# Patient Record
Sex: Female | Born: 1970 | Race: Black or African American | Hispanic: No | Marital: Single | Smoking: Former smoker
Health system: Southern US, Community
[De-identification: ages and names within clinical notes are randomized; demographics above are authoritative.]

## PROBLEM LIST (undated history)

## (undated) DIAGNOSIS — I10 Essential (primary) hypertension: Secondary | ICD-10-CM

## (undated) DIAGNOSIS — I639 Cerebral infarction, unspecified: Secondary | ICD-10-CM

## (undated) HISTORY — PX: CHOLECYSTECTOMY: SHX55

---

## 1998-11-29 ENCOUNTER — Ambulatory Visit (HOSPITAL_COMMUNITY): Admission: RE | Admit: 1998-11-29 | Discharge: 1998-11-29 | Payer: Self-pay | Admitting: *Deleted

## 1998-12-11 ENCOUNTER — Encounter: Admission: RE | Admit: 1998-12-11 | Discharge: 1998-12-11 | Payer: Self-pay | Admitting: Obstetrics & Gynecology

## 1998-12-25 ENCOUNTER — Encounter: Admission: RE | Admit: 1998-12-25 | Discharge: 1998-12-25 | Payer: Self-pay | Admitting: Obstetrics & Gynecology

## 1999-01-08 ENCOUNTER — Inpatient Hospital Stay (HOSPITAL_COMMUNITY): Admission: AD | Admit: 1999-01-08 | Discharge: 1999-01-08 | Payer: Self-pay | Admitting: Obstetrics

## 1999-01-29 ENCOUNTER — Encounter: Admission: RE | Admit: 1999-01-29 | Discharge: 1999-01-29 | Payer: Self-pay | Admitting: Obstetrics & Gynecology

## 1999-02-09 ENCOUNTER — Inpatient Hospital Stay (HOSPITAL_COMMUNITY): Admission: AD | Admit: 1999-02-09 | Discharge: 1999-02-13 | Payer: Self-pay | Admitting: *Deleted

## 1999-02-19 ENCOUNTER — Encounter: Admission: RE | Admit: 1999-02-19 | Discharge: 1999-02-19 | Payer: Self-pay | Admitting: Obstetrics & Gynecology

## 1999-02-19 ENCOUNTER — Encounter (HOSPITAL_COMMUNITY): Admission: RE | Admit: 1999-02-19 | Discharge: 1999-03-12 | Payer: Self-pay | Admitting: Obstetrics & Gynecology

## 1999-02-21 ENCOUNTER — Encounter: Payer: Self-pay | Admitting: Obstetrics

## 1999-02-21 ENCOUNTER — Inpatient Hospital Stay (HOSPITAL_COMMUNITY): Admission: AD | Admit: 1999-02-21 | Discharge: 1999-02-21 | Payer: Self-pay | Admitting: Obstetrics

## 1999-02-24 ENCOUNTER — Inpatient Hospital Stay (HOSPITAL_COMMUNITY): Admission: AD | Admit: 1999-02-24 | Discharge: 1999-02-24 | Payer: Self-pay | Admitting: Obstetrics & Gynecology

## 1999-03-05 ENCOUNTER — Encounter: Admission: RE | Admit: 1999-03-05 | Discharge: 1999-03-05 | Payer: Self-pay | Admitting: Obstetrics & Gynecology

## 1999-03-09 ENCOUNTER — Inpatient Hospital Stay (HOSPITAL_COMMUNITY): Admission: AD | Admit: 1999-03-09 | Discharge: 1999-03-09 | Payer: Self-pay | Admitting: Obstetrics

## 1999-03-11 ENCOUNTER — Inpatient Hospital Stay (HOSPITAL_COMMUNITY): Admission: AD | Admit: 1999-03-11 | Discharge: 1999-03-13 | Payer: Self-pay | Admitting: *Deleted

## 2003-05-31 ENCOUNTER — Emergency Department (HOSPITAL_COMMUNITY): Admission: AD | Admit: 2003-05-31 | Discharge: 2003-05-31 | Payer: Self-pay | Admitting: Family Medicine

## 2003-06-05 ENCOUNTER — Emergency Department (HOSPITAL_COMMUNITY): Admission: EM | Admit: 2003-06-05 | Discharge: 2003-06-05 | Payer: Self-pay | Admitting: Emergency Medicine

## 2003-10-24 ENCOUNTER — Emergency Department (HOSPITAL_COMMUNITY): Admission: EM | Admit: 2003-10-24 | Discharge: 2003-10-24 | Payer: Self-pay | Admitting: Emergency Medicine

## 2003-12-13 ENCOUNTER — Inpatient Hospital Stay (HOSPITAL_COMMUNITY): Admission: AD | Admit: 2003-12-13 | Discharge: 2003-12-13 | Payer: Self-pay | Admitting: Obstetrics

## 2004-03-20 ENCOUNTER — Inpatient Hospital Stay (HOSPITAL_COMMUNITY): Admission: AD | Admit: 2004-03-20 | Discharge: 2004-03-25 | Payer: Self-pay | Admitting: Obstetrics

## 2004-03-24 ENCOUNTER — Ambulatory Visit: Payer: Self-pay | Admitting: Neonatology

## 2004-03-28 ENCOUNTER — Inpatient Hospital Stay (HOSPITAL_COMMUNITY): Admission: AD | Admit: 2004-03-28 | Discharge: 2004-04-02 | Payer: Self-pay | Admitting: Obstetrics

## 2004-04-03 ENCOUNTER — Inpatient Hospital Stay (HOSPITAL_COMMUNITY): Admission: AD | Admit: 2004-04-03 | Discharge: 2004-04-03 | Payer: Self-pay | Admitting: Obstetrics

## 2004-04-09 ENCOUNTER — Inpatient Hospital Stay (HOSPITAL_COMMUNITY): Admission: AD | Admit: 2004-04-09 | Discharge: 2004-04-11 | Payer: Self-pay | Admitting: Obstetrics

## 2004-04-25 ENCOUNTER — Observation Stay (HOSPITAL_COMMUNITY): Admission: AD | Admit: 2004-04-25 | Discharge: 2004-04-26 | Payer: Self-pay | Admitting: Obstetrics

## 2004-05-01 ENCOUNTER — Inpatient Hospital Stay (HOSPITAL_COMMUNITY): Admission: AD | Admit: 2004-05-01 | Discharge: 2004-05-04 | Payer: Self-pay | Admitting: Obstetrics

## 2004-05-06 ENCOUNTER — Inpatient Hospital Stay (HOSPITAL_COMMUNITY): Admission: AD | Admit: 2004-05-06 | Discharge: 2004-05-06 | Payer: Self-pay | Admitting: Obstetrics

## 2004-09-18 ENCOUNTER — Emergency Department (HOSPITAL_COMMUNITY): Admission: EM | Admit: 2004-09-18 | Discharge: 2004-09-18 | Payer: Self-pay | Admitting: Family Medicine

## 2005-05-27 IMAGING — US US FETAL BPP W/O NONSTRESS
1 series · 18 of 22 positions shown · non-contrast
Comparison: none

CLINICAL DATA: 36 weeks gestation and contracting.  Nonreactive NST.

[Series 1: us fetal bpp w/o nonstress · non-contrast · 18 of 22 slices shown]
[im 1/22]
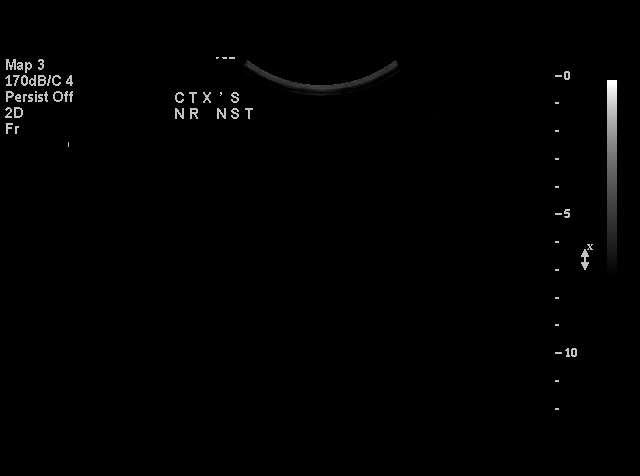
[im 2/22]
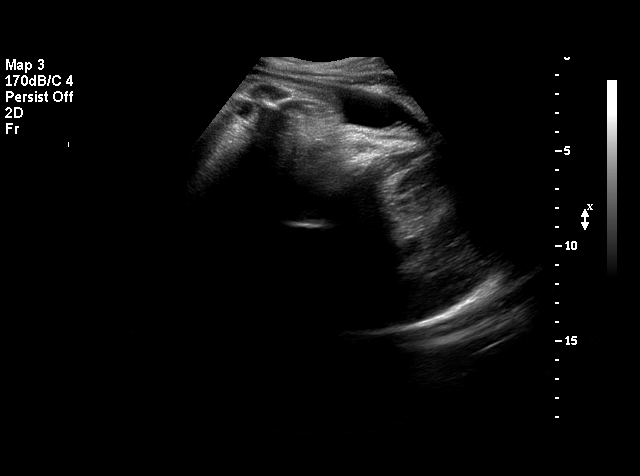
[im 4/22]
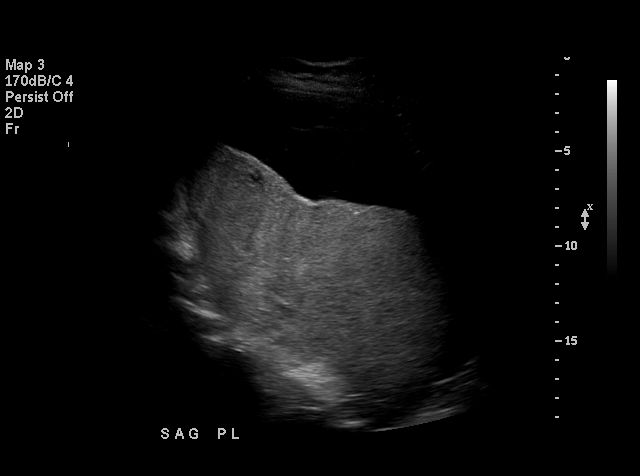
[im 5/22]
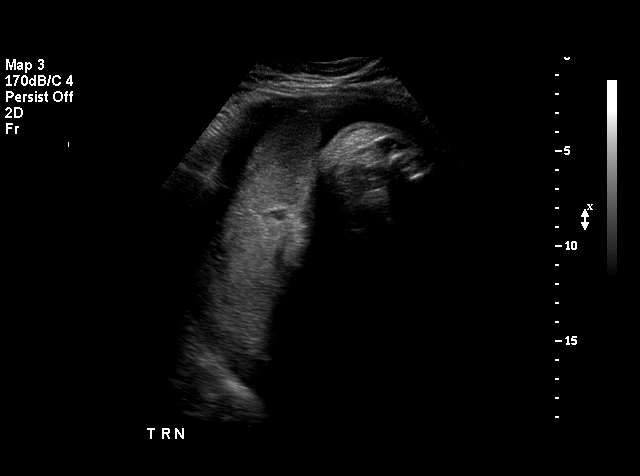
[im 6/22]
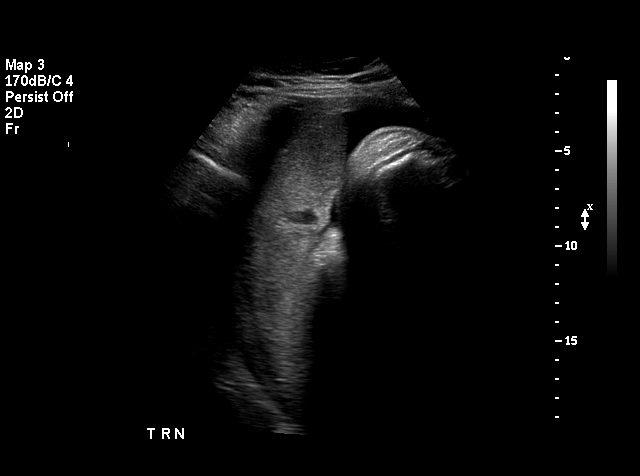
[im 7/22]
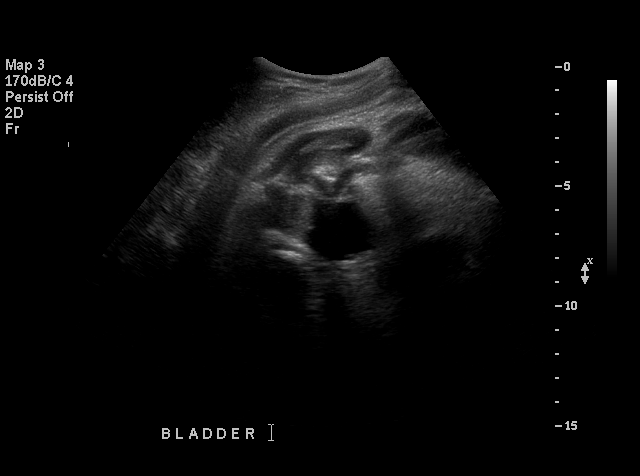
[im 8/22]
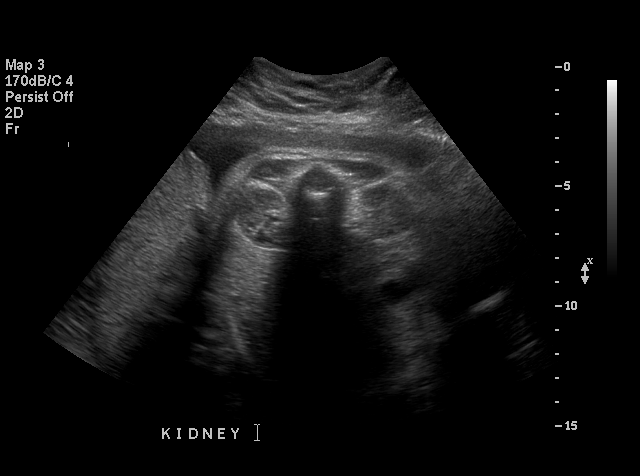
[im 10/22]
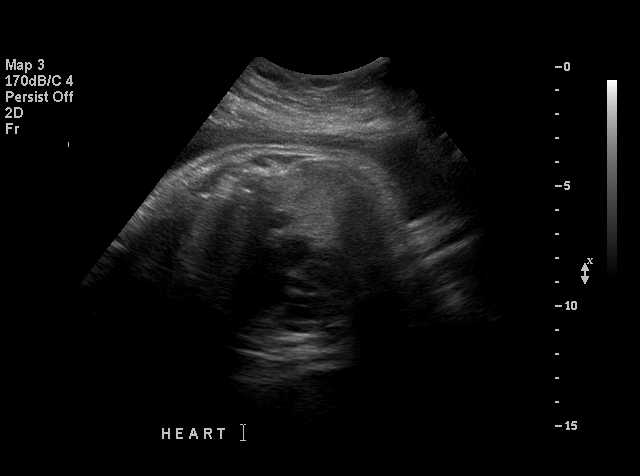
[im 11/22]
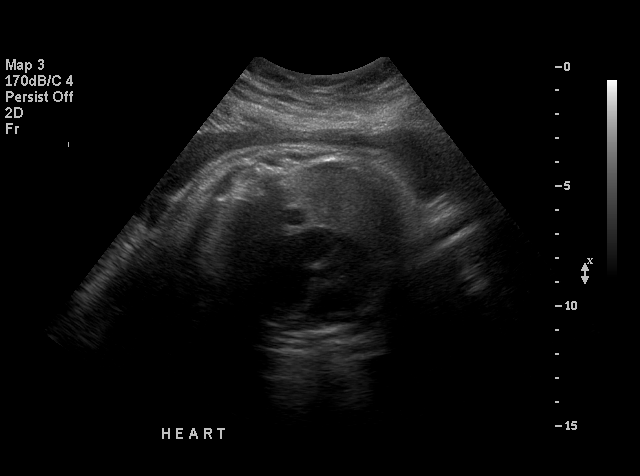
[im 12/22]
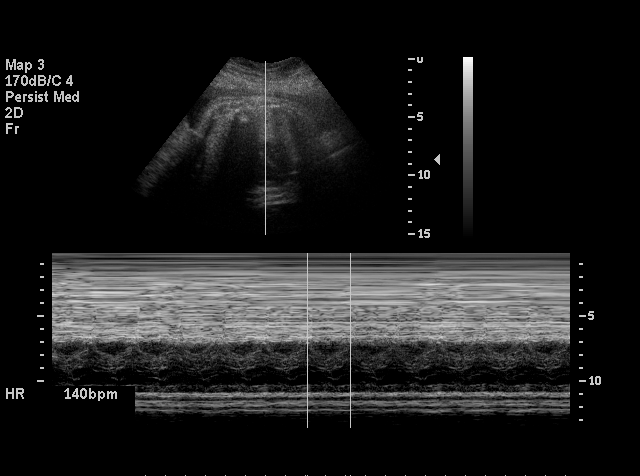
[im 13/22]
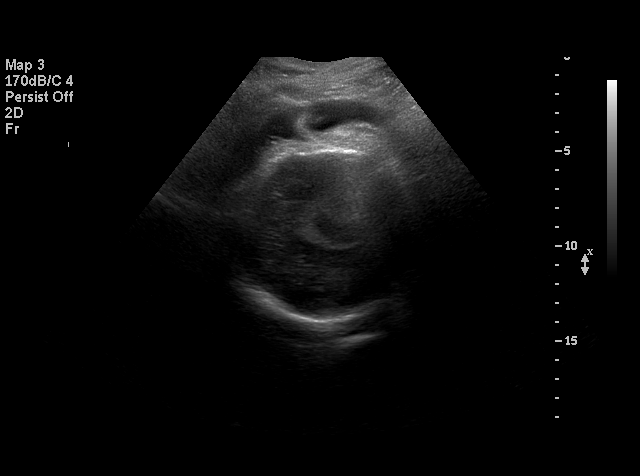
[im 15/22]
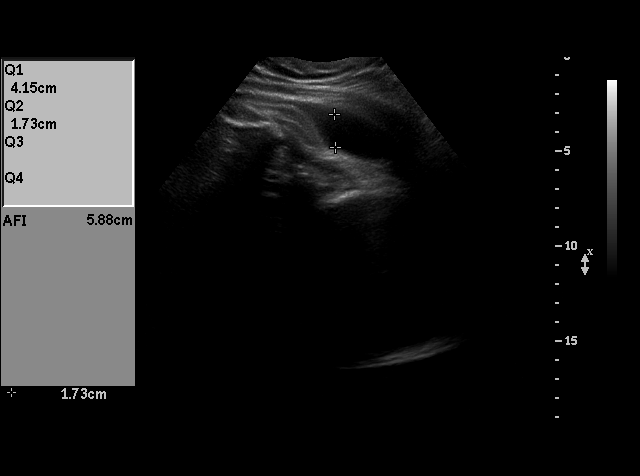
[im 16/22]
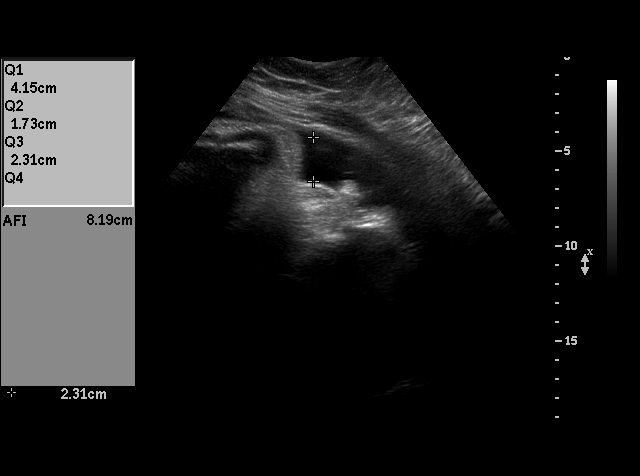
[im 17/22]
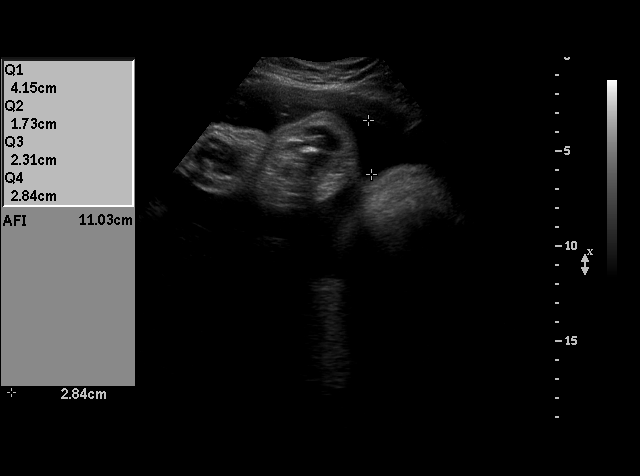
[im 18/22]
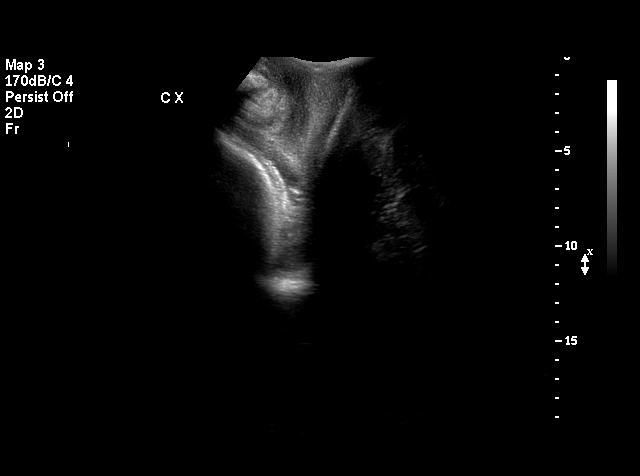
[im 19/22]
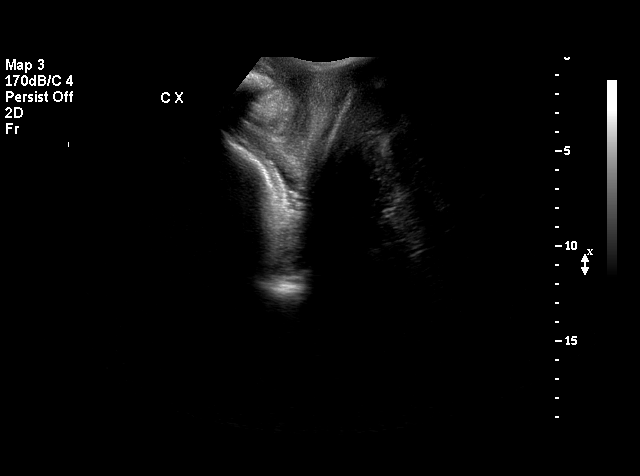
[im 21/22]
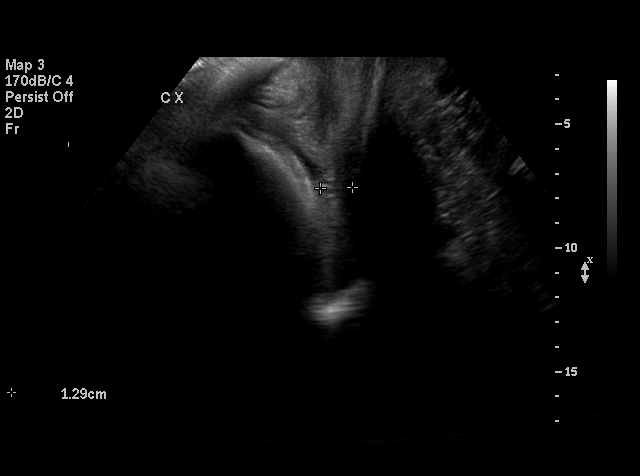
[im 22/22]
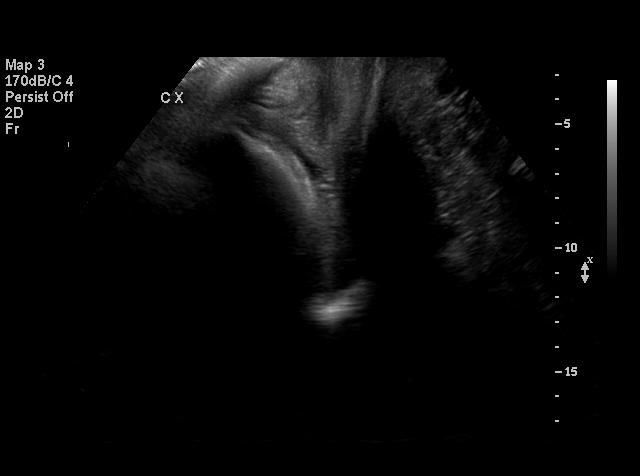

[18 of 22 positions shown; findings below may reference images not displayed]

BIOPHYSICAL PROFILE

 Number of Fetuses:  1
 Heart rate:  140
 Movement:  Yes
 Breathing:  Yes
 Presentation:  Cephalic
 Placental Location:  Posterior, right lateral
 Grade:  I
 Previa:  No
 Amniotic Fluid (Subjective):  Normal
 Amniotic Fluid (Objective):  11.0 cm AFI (5th -95th%ile = 7.9 ? 24.9 cm for 35 wks)

 Fetal measurements and complete anatomic evaluation were not requested.  The following fetal anatomy was visualized on this exam:  Stomach, kidneys, and bladder.

 BPP SCORING
 Movements:  2  Time:  10 minutes
 Breathing:  2
 Tone:  2
 Amniotic Fluid:  2
 Total Score:  8

 MATERNAL FINDINGS:
 Cervix:  1.0 cm Translabially
IMPRESSION: Single living intrauterine fetus in cephalic presentation with subjectively and quantitatively normal amniotic fluid volume.
 Biophysical profile score is [DATE] after 10 minutes of observation.

## 2005-06-28 ENCOUNTER — Emergency Department (HOSPITAL_COMMUNITY): Admission: EM | Admit: 2005-06-28 | Discharge: 2005-06-29 | Payer: Self-pay | Admitting: Emergency Medicine

## 2005-10-20 IMAGING — CR DG CHEST 2V
2 series · 2 of 2 positions shown · non-contrast
Comparison: 03/28/2004.

CLINICAL DATA: Left chest pain and shortness of breath.

CHEST - 2 VIEW

[view not recorded (1 of 2)]
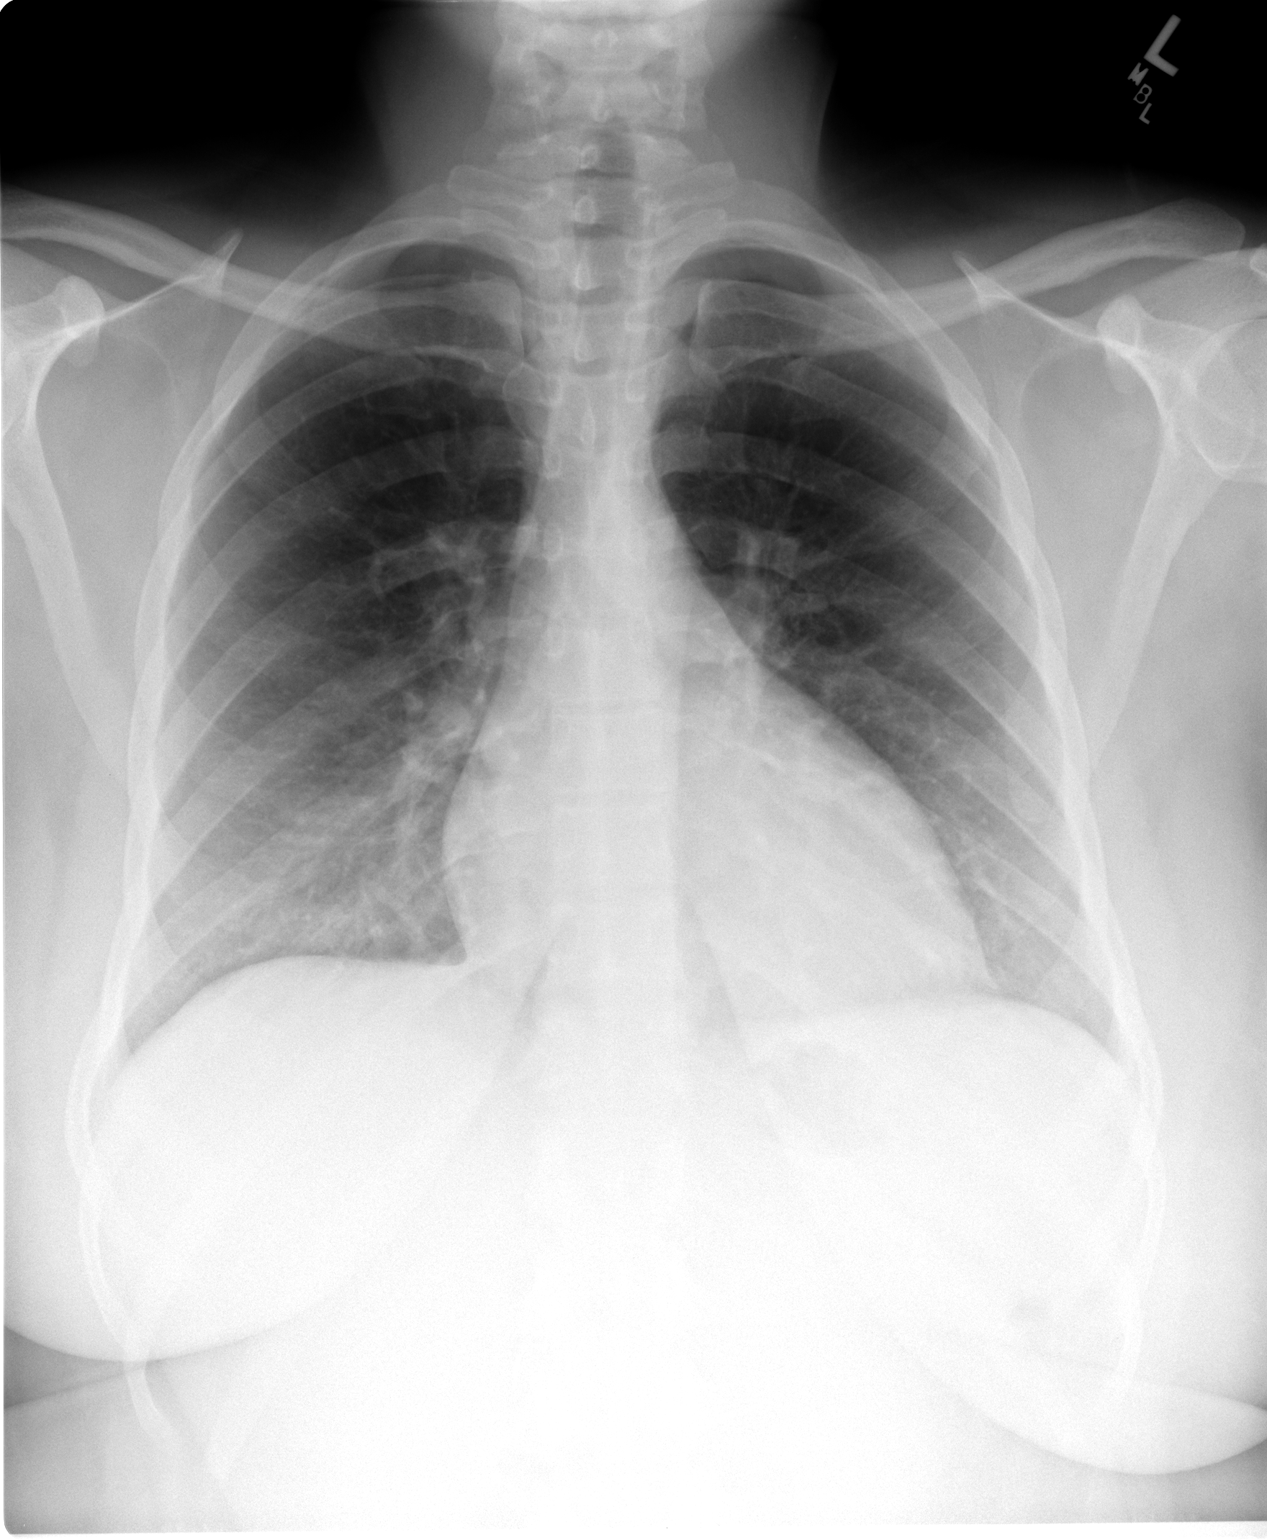

[view not recorded (2 of 2)]
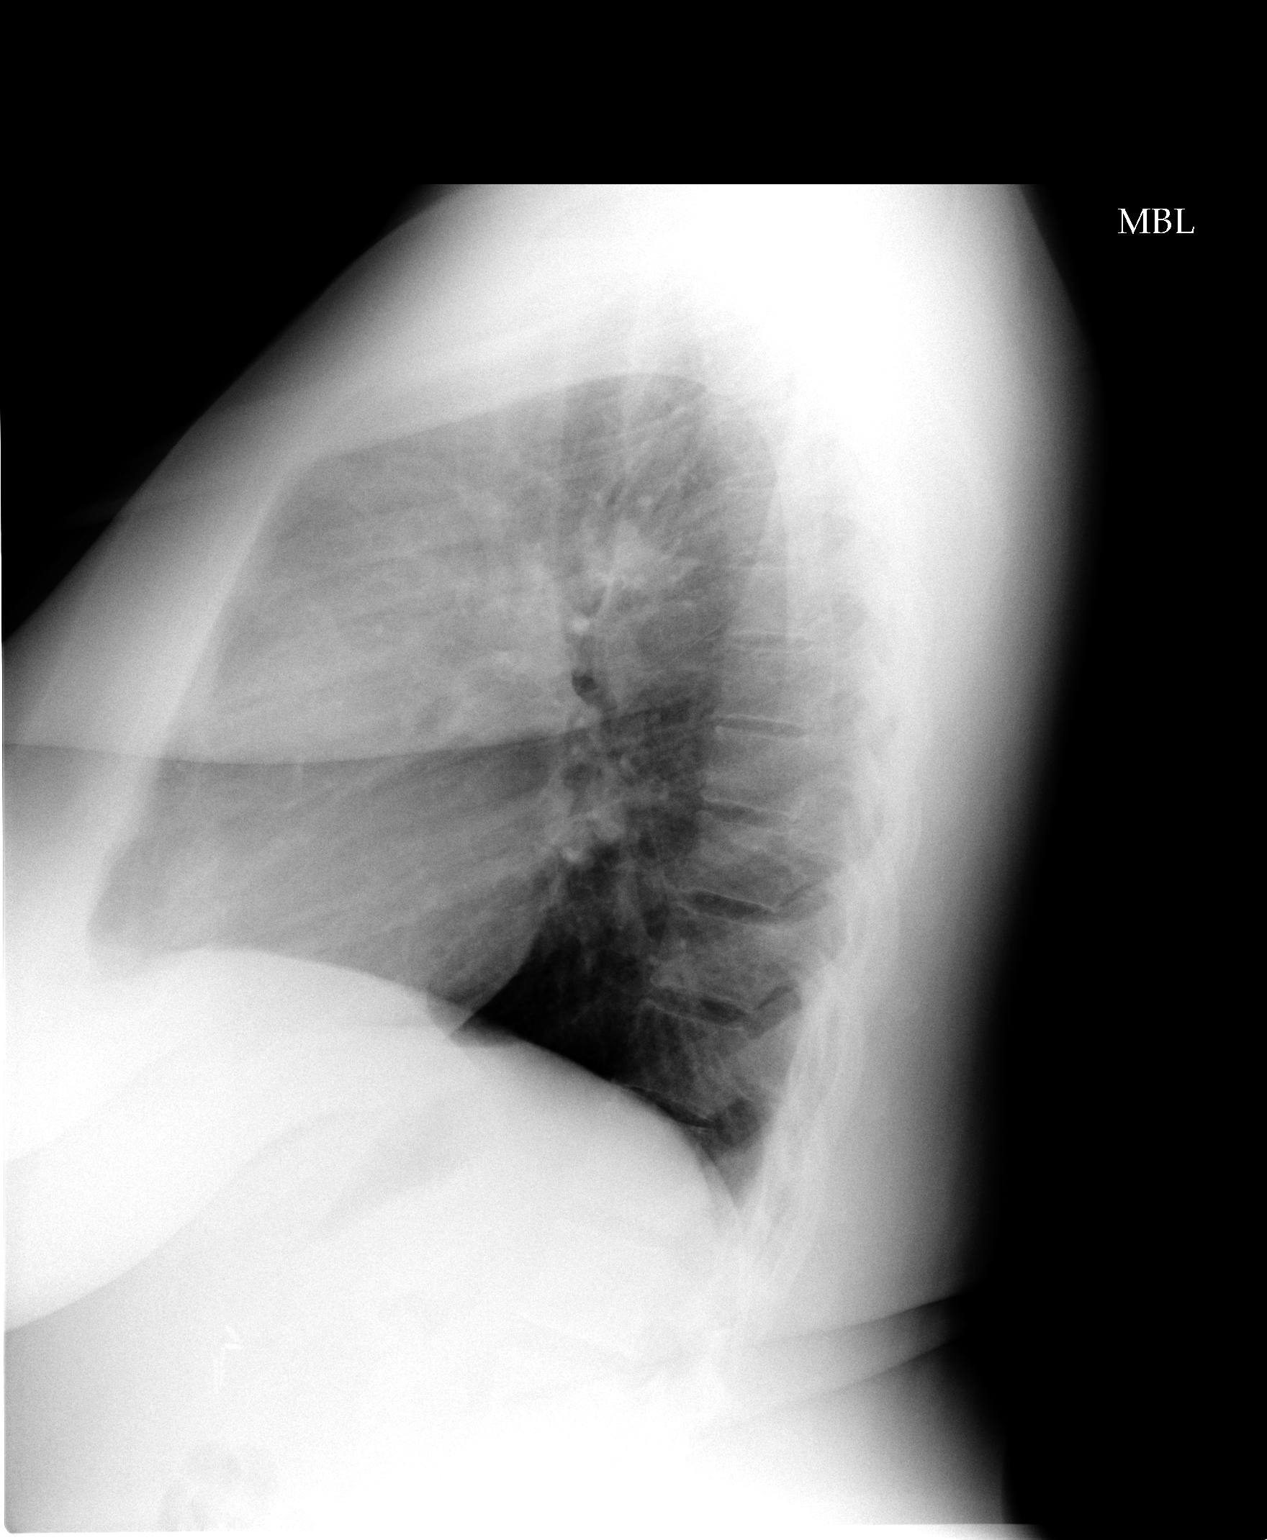

[2 of 2 positions shown; findings below may reference images not displayed]

FINDINGS: The cardiac silhouette is near the upper limit of normal in size. The
lungs remain clear. Cholecystectomy clips are again demonstrated. Unremarkable
bones.

IMPRESSION

No acute abnormality.

## 2010-08-02 ENCOUNTER — Encounter: Payer: Self-pay | Admitting: Obstetrics

## 2022-06-19 ENCOUNTER — Emergency Department (HOSPITAL_BASED_OUTPATIENT_CLINIC_OR_DEPARTMENT_OTHER): Payer: Medicare Other

## 2022-06-19 ENCOUNTER — Emergency Department (HOSPITAL_BASED_OUTPATIENT_CLINIC_OR_DEPARTMENT_OTHER)
Admission: EM | Admit: 2022-06-19 | Discharge: 2022-06-19 | Disposition: A | Discharge status: Home / Self Care | Payer: Medicare Other | Attending: Emergency Medicine | Admitting: Emergency Medicine

## 2022-06-19 ENCOUNTER — Encounter (HOSPITAL_BASED_OUTPATIENT_CLINIC_OR_DEPARTMENT_OTHER): Payer: Self-pay | Admitting: Urology

## 2022-06-19 DIAGNOSIS — I10 Essential (primary) hypertension: Secondary | ICD-10-CM | POA: Insufficient documentation

## 2022-06-19 DIAGNOSIS — Z7982 Long term (current) use of aspirin: Secondary | ICD-10-CM | POA: Diagnosis not present

## 2022-06-19 DIAGNOSIS — E119 Type 2 diabetes mellitus without complications: Secondary | ICD-10-CM | POA: Insufficient documentation

## 2022-06-19 DIAGNOSIS — Z79899 Other long term (current) drug therapy: Secondary | ICD-10-CM | POA: Insufficient documentation

## 2022-06-19 DIAGNOSIS — Z7984 Long term (current) use of oral hypoglycemic drugs: Secondary | ICD-10-CM | POA: Insufficient documentation

## 2022-06-19 DIAGNOSIS — R079 Chest pain, unspecified: Secondary | ICD-10-CM

## 2022-06-19 DIAGNOSIS — I493 Ventricular premature depolarization: Secondary | ICD-10-CM

## 2022-06-19 HISTORY — DX: Essential (primary) hypertension: I10

## 2022-06-19 HISTORY — DX: Cerebral infarction, unspecified: I63.9

## 2022-06-19 LAB — CBC
HCT: 44 % (ref 36.0–46.0)
Hemoglobin: 14 g/dL (ref 12.0–15.0)
MCH: 24.8 pg — ABNORMAL LOW (ref 26.0–34.0)
MCHC: 31.8 g/dL (ref 30.0–36.0)
MCV: 77.9 fL — ABNORMAL LOW (ref 80.0–100.0)
Platelets: 405 10*3/uL — ABNORMAL HIGH (ref 150–400)
RBC: 5.65 MIL/uL — ABNORMAL HIGH (ref 3.87–5.11)
RDW: 15.6 % — ABNORMAL HIGH (ref 11.5–15.5)
WBC: 8.5 10*3/uL (ref 4.0–10.5)
nRBC: 0 % (ref 0.0–0.2)

## 2022-06-19 LAB — BASIC METABOLIC PANEL
Anion gap: 6 (ref 5–15)
BUN: 17 mg/dL (ref 6–20)
CO2: 23 mmol/L (ref 22–32)
Calcium: 8.9 mg/dL (ref 8.9–10.3)
Chloride: 107 mmol/L (ref 98–111)
Creatinine, Ser: 1.19 mg/dL — ABNORMAL HIGH (ref 0.44–1.00)
GFR, Estimated: 56 mL/min — ABNORMAL LOW (ref >60)
Glucose, Bld: 84 mg/dL (ref 70–99)
Potassium: 4.3 mmol/L (ref 3.5–5.1)
Sodium: 136 mmol/L (ref 135–145)

## 2022-06-19 LAB — MAGNESIUM: Magnesium: 2.2 mg/dL (ref 1.7–2.4)

## 2022-06-19 LAB — TROPONIN I (HIGH SENSITIVITY): Troponin I (High Sensitivity): 7 ng/L (ref <18)

## 2022-06-19 NOTE — ED Notes (Signed)
Called lab to add Mag

## 2022-06-19 NOTE — ED Triage Notes (Signed)
Left sided chest pain since Monday  Denies SOB , Denies N/V   H/o stroke in 2021, cardiac cath in 2022

## 2022-06-19 NOTE — ED Provider Notes (Signed)
[facility] Provider Note   CSN: [id number] Arrival date & time: 06/19/22  1831     History  Chief Complaint  Patient presents with   Chest Pain    Melody Robles is a 51 y.o. female.  Patient is a 51 year old female with a past medical history of hypertension, hyperlipidemia, diabetes and prior stroke with mild right-sided deficits presenting to the emergency department with chest pain.  The patient states that since Monday she has had on and off left-sided chest pain.  She states that it feels like a sharp stabbing type of pain that lasts for only a few seconds at a time.  She states that the pain has become more severe and more constant in the last day which prompted her to come to the emergency department.  She states that the pain has occasionally woken her up from her sleep.  She denies any shortness of breath, lightheadedness or dizziness, nausea or vomiting.  She denies any recent fever or cough or lower extremity swelling.  She denies any history of blood clots, recent hospitalizations or surgeries, recent long travels in the car or plane, hormone use or cancer history.  She states that she had a cardiac catheterization done 1 to 2 years ago that was negative.  The history is provided by the patient.  Chest Pain      Home Medications Prior to Admission medications   Medication Sig Start Date End Date Taking? Authorizing Provider  aspirin EC 81 MG tablet Take 1 tablet by mouth daily. 11/25/20 06/12/23 Yes [provider]  atorvastatin (LIPITOR) 10 MG tablet Take by mouth. 06/12/22 06/12/23 Yes [provider]  hydrochlorothiazide (HYDRODIURIL) 12.5 MG tablet Take 1 tablet by mouth every morning. 06/12/22 06/12/23 Yes [provider]  lisinopril (ZESTRIL) 20 MG tablet Take 1 tablet by mouth daily. 08/21/21 06/12/23 Yes [provider]  metFORMIN (GLUCOPHAGE) 500 MG tablet Take by mouth 2 (two) times daily with a  meal.   Yes [provider]  metoprolol succinate (TOPROL-XL) 25 MG 24 hr tablet Take 1 tablet by mouth daily. 08/21/21 06/12/23 Yes [provider]      Allergies    Morphine    Review of Systems   Review of Systems  Cardiovascular:  Positive for chest pain.    Physical Exam Updated Vital Signs BP (!) 142/85 (BP Location: Right Arm)   Pulse 98   Temp 97.9 F (36.6 C) (Oral)   Resp 20   Ht 5\' 6"  (1.676 m)   Wt 115.7 kg   SpO2 98%   BMI 41.16 kg/m  Physical Exam Vitals and nursing note reviewed.  Constitutional:      General: She is not in acute distress.    Appearance: She is well-developed. She is not diaphoretic.     Comments: Mildly anxious appearing  HENT:     Head: Normocephalic and atraumatic.  Eyes:     Extraocular Movements: Extraocular movements intact.  Neck:     Vascular: No JVD.  Cardiovascular:     Rate and Rhythm: Normal rate and regular rhythm.     Pulses:          Radial pulses are 2+ on the right side and 2+ on the left side.     Heart sounds: Normal heart sounds.  Pulmonary:     Effort: Pulmonary effort is normal.     Breath sounds: Normal breath sounds.  Chest:     Chest wall: No  tenderness.  Abdominal:     Palpations: Abdomen is soft.     Tenderness: There is no abdominal tenderness.  Musculoskeletal:        General: Normal range of motion.     Cervical back: Normal range of motion and neck supple.     Right lower leg: No edema.     Left lower leg: No edema.  Skin:    General: Skin is warm and dry.  Neurological:     General: No focal deficit present.     Mental Status: She is alert and oriented to person, place, and time.  Psychiatric:        Mood and Affect: Mood normal.        Behavior: Behavior normal.     ED Results / Procedures / Treatments   Labs (all labs ordered are listed, but only abnormal results are displayed) Labs Reviewed  BASIC METABOLIC PANEL - Abnormal; Notable for the following components:       Result Value   Creatinine, Ser 1.19 (*)    GFR, Estimated 56 (*)    All other components within normal limits  CBC - Abnormal; Notable for the following components:   RBC 5.65 (*)    MCV 77.9 (*)    MCH 24.8 (*)    RDW 15.6 (*)    Platelets 405 (*)    All other components within normal limits  MAGNESIUM  TROPONIN I (HIGH SENSITIVITY)    EKG EKG Interpretation  Date/Time:  Friday June 19 2022 19:27:27 EST Ventricular Rate:  88 PR Interval:  173 QRS Duration: 100 QT Interval:  381 QTC Calculation: 461 R Axis:   41 Text Interpretation: Sinus rhythm Borderline T abnormalities, inferior leads ST elev, probable normal early repol pattern PVCs improved otherwise no significant change from prior EKG this encounter Confirmed by Elayne Snare ([id number]) on 06/19/2022 7:30:42 PM  Radiology DG Chest 2 View  Result Date: 06/19/2022 CLINICAL DATA:  Chest pain EXAM: CHEST - 2 VIEW COMPARISON:  03/22/2018 FINDINGS: Mildly enlarged cardiac contour. Normal mediastinal contour. No focal pulmonary opacity. No pleural effusion or pneumothorax. No acute osseous abnormality. IMPRESSION: Mild cardiomegaly.  No additional acute cardiopulmonary process. Electronically Signed   By: Wiliam Ke M.D.   On: 06/19/2022 19:18    Procedures Procedures    Medications Ordered in ED Medications - No data to display  ED Course/ Medical Decision Making/ A&P Clinical Course as of 06/19/22 1933  Fri Jun 19, 2022  1930 Initial troponin is negative due to the pain ongoing for several days single troponin is sufficient.  Repeat EKG was performed now that her PVCs are less frequent and she has no acute ischemic changes on her EKG.  Patient has still been feeling intermittent chest pain not in the emergency department but is just lasting for seconds at a time so she was recommended outpatient primary care and cardiology follow-up.  She was given strict return precautions. [VK]    Clinical Course User  Index [VK] Rexford Maus, DO                           Medical Decision Making This patient presents to the ED with chief complaint(s) of chest pain with pertinent past medical history of CVA, HTN, HLD, DM which further complicates the presenting complaint. The complaint involves an extensive differential diagnosis and also carries with it a high risk of complications and morbidity.    The  differential diagnosis includes ACS, arrhythmia, no evidence of volume overload making CHF exacerbation unlikely, pulmonary edema, pleural effusion, pneumothorax, pneumonia and viral syndrome less likely as no fever or cough, anemia, electrolyte abnormality, patient has equal pulses bilaterally no radiating symptoms making dissection unlikely  Additional history obtained: Additional history obtained from N/A Records reviewed Care Everywhere/External Records - outpatient cardiology and neurology records  ED Course and Reassessment: Upon patient's arrival to the emergency department she is awake alert and hemodynamically stable.  Her EKG does show normal sinus rhythm with multiple PVCs and no obvious ischemic changes.  She will have labs including troponin and electrolytes performed and will be closely reassessed.  She declines any pain medication at this time.  Independent labs interpretation:  The following labs were independently interpreted: within normal range  Independent visualization of imaging: - I independently visualized the following imaging with scope of interpretation limited to determining acute life threatening conditions related to emergency care: CXR, which revealed no acute disease  Consultation: - Consulted or discussed management/test interpretation w/ external professional: N/A  Consideration for admission or further workup: Patient has no emergent conditions requiring admission or further work-up at this time and is stable for discharge home with primary care and cardiology  follow-up  Social Determinants of health: N/A    Amount and/or Complexity of Data Reviewed Labs: ordered. Radiology: ordered.          Final Clinical Impression(s) / ED Diagnoses Final diagnoses:  Nonspecific chest pain  PVC's (premature ventricular contractions)    Rx / DC Orders ED Discharge Orders     None         Kemper Durie, DO 06/19/22 1933

## 2022-06-19 NOTE — Discharge Instructions (Signed)
You were seen in the emergency department for your chest pain.  Your workup showed no signs of heart attack or stress on your heart and no signs of any abnormalities with your lungs.  You did have an occasional extra beats on your EKG called PVCs but these do not usually cause pain and do not usually cause any complications.  Is unclear what the cause of your pain is at this time but you can follow-up with your primary doctor and your cardiologist for further evaluation and workup.  You should return to the emergency department if your pain gets significantly worse and does not go away, you develop shortness of breath, you pass out or if you have any other new or concerning symptoms.

## 2022-08-28 ENCOUNTER — Emergency Department (HOSPITAL_BASED_OUTPATIENT_CLINIC_OR_DEPARTMENT_OTHER): Payer: 59

## 2022-08-28 ENCOUNTER — Encounter (HOSPITAL_BASED_OUTPATIENT_CLINIC_OR_DEPARTMENT_OTHER): Payer: Self-pay

## 2022-08-28 ENCOUNTER — Other Ambulatory Visit: Payer: Self-pay

## 2022-08-28 ENCOUNTER — Emergency Department (HOSPITAL_BASED_OUTPATIENT_CLINIC_OR_DEPARTMENT_OTHER)
Admission: EM | Admit: 2022-08-28 | Discharge: 2022-08-28 | Disposition: A | Discharge status: Home / Self Care | Payer: 59 | Attending: Emergency Medicine | Admitting: Emergency Medicine

## 2022-08-28 DIAGNOSIS — M25551 Pain in right hip: Secondary | ICD-10-CM | POA: Insufficient documentation

## 2022-08-28 DIAGNOSIS — D72829 Elevated white blood cell count, unspecified: Secondary | ICD-10-CM | POA: Diagnosis not present

## 2022-08-28 DIAGNOSIS — N3000 Acute cystitis without hematuria: Secondary | ICD-10-CM | POA: Insufficient documentation

## 2022-08-28 DIAGNOSIS — Z7982 Long term (current) use of aspirin: Secondary | ICD-10-CM | POA: Diagnosis not present

## 2022-08-28 DIAGNOSIS — M545 Low back pain, unspecified: Secondary | ICD-10-CM | POA: Diagnosis present

## 2022-08-28 DIAGNOSIS — N3090 Cystitis, unspecified without hematuria: Secondary | ICD-10-CM

## 2022-08-28 LAB — URINALYSIS, MICROSCOPIC (REFLEX): WBC, UA: >50 WBC/hpf (ref 0–5)

## 2022-08-28 LAB — URINALYSIS, ROUTINE W REFLEX MICROSCOPIC
Bilirubin Urine: NEGATIVE
Glucose, UA: NEGATIVE mg/dL
Hgb urine dipstick: NEGATIVE
Ketones, ur: NEGATIVE mg/dL
Nitrite: NEGATIVE
Protein, ur: NEGATIVE mg/dL
Specific Gravity, Urine: >=1.03 (ref 1.005–1.030)
pH: 5.5 (ref 5.0–8.0)

## 2022-08-28 MED ORDER — CYCLOBENZAPRINE HCL 10 MG PO TABS: 10.0000 mg | ORAL_TABLET | Freq: Two times a day (BID) | ORAL | 0 refills | Status: DC | PRN

## 2022-08-28 MED ORDER — CYCLOBENZAPRINE HCL 5 MG PO TABS
5.0000 mg | ORAL_TABLET | Freq: Once | ORAL | Status: AC
  Administered 2022-08-28: 5 mg via ORAL
  Filled 2022-08-28: qty 1

## 2022-08-28 MED ORDER — CEFADROXIL 500 MG PO CAPS: 500.0000 mg | ORAL_CAPSULE | Freq: Two times a day (BID) | ORAL | 0 refills | Status: DC

## 2022-08-28 MED ORDER — KETOROLAC TROMETHAMINE 30 MG/ML IJ SOLN
30.0000 mg | Freq: Once | INTRAMUSCULAR | Status: AC
  Administered 2022-08-28: 30 mg via INTRAMUSCULAR
  Filled 2022-08-28: qty 1

## 2022-08-28 NOTE — Discharge Instructions (Addendum)
Your urine looks infected.  Will treat for urinary tract infection with extended course given concerns for infection of the kidney.  Will also send in muscle laxer to use as needed for right-sided muscular spasm.  You can continue to take Tylenol for baseline pain.  If pain does not get better or worsens, please return to emergency department for further evaluation as there is still concern for possible kidney stone.  Please do not hesitate to return to emergency department the worrisome signs and symptoms we discussed become apparent.

## 2022-08-28 NOTE — ED Triage Notes (Signed)
Pt to er, pt states that Tuesday she started having some R hip pain.  States that it hurts to turn over, hurts to get up, states that when she is walking she doesn't have any pain.  States that she has never had anything like this before, states that the pain doesn't radiate anywhere.

## 2022-08-28 NOTE — ED Provider Notes (Signed)
[facility]   CSN: [id number] Arrival date & time: 08/28/22  1554     History  Chief Complaint  Patient presents with   Hip Pain    Melody Robles is a 52 y.o. female.   Hip Pain   52 year old female presents emergency department with complaints of right low back pain.  Patient states that symptoms began Tuesday when she was getting out of a bus.  Patient states the pain has been constant since onset.  States she has tried at home Tylenol which has helped symptoms as well as hot bath.  Denies saddle anesthesia, bowel/bladder dysfunction, weakness/sensory deficits in lower extremities, fever, history of IV drug use.  Denies nausea, vomiting, fever, urinary symptoms, vaginal symptoms, change in bowel habits.  Patient states the pain is worsened with movement as well as pressing on affected area and relieved with rest.  States the pain radiates into right buttock region but not beyond.  Past medical history significant for hypertension, CVA  Home Medications Prior to Admission medications   Medication Sig Start Date End Date Taking? Authorizing Provider  cefadroxil (DURICEF) 500 MG capsule Take 1 capsule (500 mg total) by mouth 2 (two) times daily. 08/28/22  Yes Dion Saucier A, PA  cyclobenzaprine (FLEXERIL) 10 MG tablet Take 1 tablet (10 mg total) by mouth 2 (two) times daily as needed for muscle spasms. 08/28/22  Yes Dion Saucier A, PA  aspirin EC 81 MG tablet Take 1 tablet by mouth daily. 11/25/20 06/12/23  [provider]  atorvastatin (LIPITOR) 10 MG tablet Take by mouth. 06/12/22 06/12/23  [provider]  hydrochlorothiazide (HYDRODIURIL) 12.5 MG tablet Take 1 tablet by mouth every morning. 06/12/22 06/12/23  [provider]  lisinopril (ZESTRIL) 20 MG tablet Take 1 tablet by mouth daily. 08/21/21 06/12/23  [provider]  metFORMIN (GLUCOPHAGE) 500 MG tablet Take by mouth 2 (two)  times daily with a meal.    [provider]  metoprolol succinate (TOPROL-XL) 25 MG 24 hr tablet Take 1 tablet by mouth daily. 08/21/21 06/12/23  [provider]      Allergies    Morphine and Benadryl [diphenhydramine]    Review of Systems   Review of Systems  All other systems reviewed and are negative.   Physical Exam Updated Vital Signs BP 122/78 (BP Location: Left Arm)   Pulse 85   Temp 98.3 F (36.8 C) (Oral)   Resp 18   Ht 5' 6.5" (1.689 m)   Wt 113.4 kg   SpO2 100%   BMI 39.75 kg/m  Physical Exam Vitals and nursing note reviewed.  Constitutional:      General: She is not in acute distress.    Appearance: She is well-developed.  HENT:     Head: Normocephalic and atraumatic.  Eyes:     Conjunctiva/sclera: Conjunctivae normal.  Cardiovascular:     Rate and Rhythm: Normal rate and regular rhythm.     Heart sounds: No murmur heard. Pulmonary:     Effort: Pulmonary effort is normal. No respiratory distress.     Breath sounds: Normal breath sounds. No wheezing, rhonchi or rales.  Abdominal:     Palpations: Abdomen is soft.     Tenderness: There is no abdominal tenderness. There is no right CVA tenderness or left CVA tenderness.  Musculoskeletal:        General: No swelling.     Cervical back: Neck supple.     Comments:  9.  Tenderness of cervical, thoracic, lumbar spine with no obvious step-off or deformity noted.  Paraspinal tenderness noted in the lower lumbar region right-sided.  Tenderness extends into right buttock but not beyond.  Muscle strength 5 out of 5 for lower extremities.  No sensory deficits along major nerve distributions of lower extremities.  DTR symmetrical full bilaterally.  Pedal pulses 2+ bilaterally.  No overlying skin abnormalities appreciated.  Skin:    General: Skin is warm and dry.     Capillary Refill: Capillary refill takes less than 2 seconds.  Neurological:     Mental Status: She is alert.  Psychiatric:        Mood and  Affect: Mood normal.     ED Results / Procedures / Treatments   Labs (all labs ordered are listed, but only abnormal results are displayed) Labs Reviewed  URINALYSIS, ROUTINE W REFLEX MICROSCOPIC - Abnormal; Notable for the following components:      Result Value   APPearance HAZY (*)    Leukocytes,Ua TRACE (*)    All other components within normal limits  URINALYSIS, MICROSCOPIC (REFLEX) - Abnormal; Notable for the following components:   Bacteria, UA MANY (*)    All other components within normal limits    EKG None  Radiology DG Hip Unilat W or Wo Pelvis 2-3 Views Right  Result Date: 08/28/2022 CLINICAL DATA:  Right hip pain.  No injury. EXAM: DG HIP (WITH OR WITHOUT PELVIS) 2-3V RIGHT COMPARISON:  Pelvis x-ray dated October 05, 2018. FINDINGS: There is no evidence of hip fracture or dislocation. There is no evidence of arthropathy or other focal bone abnormality. IMPRESSION: Negative. Electronically Signed   By: Titus Dubin M.D.   On: 08/28/2022 16:51    Procedures Procedures    Medications Ordered in ED Medications  cyclobenzaprine (FLEXERIL) tablet 5 mg (5 mg Oral Given 08/28/22 1723)  ketorolac (TORADOL) 30 MG/ML injection 30 mg (30 mg Intramuscular Given 08/28/22 1730)    ED Course/ Medical Decision Making/ A&P                             Medical Decision Making  This patient presents to the ED for concern of back pain, this involves an extensive number of treatment options, and is a complaint that carries with it a high risk of complications and morbidity.  The differential diagnosis includes cauda equina syndrome, spinal epidural abscess, nephrolithiasis, pyelonephritis, strain/sprain, muscular spasm, fracture, dislocation, osteoarthritis   Co morbidities that complicate the patient evaluation  See HPI   Additional history obtained:  Additional history obtained from EMR External records from outside source obtained and reviewed including hospital  records   Lab Tests:  I Ordered, and personally interpreted labs.  The pertinent results include: UA significant for many bacteria, greater than 50 WBC and trace leukocytes with 0-5 RBCs.   Imaging Studies ordered:  I ordered imaging studies including DG pelvis with right hip I independently visualized and interpreted imaging which showed no acute osseous abnormalities I agree with the radiologist interpretation   Cardiac Monitoring: / EKG:  The patient was maintained on a cardiac monitor.  I personally viewed and interpreted the cardiac monitored which showed an underlying rhythm of: Sinus rhythm   Consultations Obtained:  N/a   Problem List / ED Course / Critical interventions / Medication management  Right low back pain, cystitis I ordered medication including Toradol, Flexeril   Reevaluation of the patient after  these medicines showed that the patient improved I have reviewed the patients home medicines and have made adjustments as needed   Social Determinants of Health:  Chronic cigarette use.  Denies illicit drug use.   Test / Admission - Considered:  Right low back pain/cystitis Vitals signs  within normal range and stable throughout visit. Laboratory/imaging studies significant for: See above Patient with evidence of right low back pain most likely musculoskeletal in origin given relief of symptoms with administration of Toradol and Flexeril.  Patient without urinary symptoms but urine does show infection.  Will treat with extended course of antibiotics for possible concerns for pyelonephritis given right-sided back pain with concurrent UA findings.  Very low suspicion for nephrolithiasis given overall generally well-appearing patient with no RBCs in urine.  May flag signs negative for back pain so doubt cauda equina/spinal epidural abscess.  Recommend reevaluation by primary care for reassessment of symptoms.  Treatment plan discussed at length with patient and she  acknowledged understanding was agreeable to said plan. Worrisome signs and symptoms were discussed with the patient, and the patient acknowledged understanding to return to the ED if noticed. Patient was stable upon discharge.          Final Clinical Impression(s) / ED Diagnoses Final diagnoses:  Cystitis  Acute right-sided low back pain without sciatica    Rx / DC Orders ED Discharge Orders          Ordered    cefadroxil (DURICEF) 500 MG capsule  2 times daily        08/28/22 1757    cyclobenzaprine (FLEXERIL) 10 MG tablet  2 times daily PRN        08/28/22 1757              Wilnette Kales, PA 08/28/22 1818    Lennice Sites, DO 08/28/22 1908

## 2022-08-28 NOTE — ED Notes (Signed)
Pt sitting up in bed, pt states that she is ready to go home, pt verbalized understanding d/c instructions and follow up, pt ambulatory from dpt with steady gait. States that she doesn't drive.

## 2022-09-11 ENCOUNTER — Encounter (HOSPITAL_BASED_OUTPATIENT_CLINIC_OR_DEPARTMENT_OTHER): Payer: Self-pay

## 2022-09-11 ENCOUNTER — Other Ambulatory Visit: Payer: Self-pay

## 2022-09-11 ENCOUNTER — Emergency Department (HOSPITAL_BASED_OUTPATIENT_CLINIC_OR_DEPARTMENT_OTHER): Payer: 59

## 2022-09-11 ENCOUNTER — Emergency Department (HOSPITAL_BASED_OUTPATIENT_CLINIC_OR_DEPARTMENT_OTHER)
Admission: EM | Admit: 2022-09-11 | Discharge: 2022-09-11 | Disposition: A | Discharge status: Home / Self Care | Payer: 59 | Attending: Emergency Medicine | Admitting: Emergency Medicine

## 2022-09-11 DIAGNOSIS — Z79899 Other long term (current) drug therapy: Secondary | ICD-10-CM | POA: Diagnosis not present

## 2022-09-11 DIAGNOSIS — M62838 Other muscle spasm: Secondary | ICD-10-CM | POA: Diagnosis not present

## 2022-09-11 DIAGNOSIS — I10 Essential (primary) hypertension: Secondary | ICD-10-CM | POA: Diagnosis not present

## 2022-09-11 DIAGNOSIS — Z7982 Long term (current) use of aspirin: Secondary | ICD-10-CM | POA: Insufficient documentation

## 2022-09-11 DIAGNOSIS — M79604 Pain in right leg: Secondary | ICD-10-CM | POA: Diagnosis present

## 2022-09-11 DIAGNOSIS — M791 Myalgia, unspecified site: Secondary | ICD-10-CM

## 2022-09-11 LAB — COMPREHENSIVE METABOLIC PANEL
ALT: 42 U/L (ref 0–44)
AST: 29 U/L (ref 15–41)
Albumin: 3.7 g/dL (ref 3.5–5.0)
Alkaline Phosphatase: 88 U/L (ref 38–126)
Anion gap: 5 (ref 5–15)
BUN: 17 mg/dL (ref 6–20)
CO2: 24 mmol/L (ref 22–32)
Calcium: 8.7 mg/dL — ABNORMAL LOW (ref 8.9–10.3)
Chloride: 103 mmol/L (ref 98–111)
Creatinine, Ser: 1.09 mg/dL — ABNORMAL HIGH (ref 0.44–1.00)
GFR, Estimated: >60 mL/min (ref >60)
Glucose, Bld: 126 mg/dL — ABNORMAL HIGH (ref 70–99)
Potassium: 4.2 mmol/L (ref 3.5–5.1)
Sodium: 132 mmol/L — ABNORMAL LOW (ref 135–145)
Total Bilirubin: 0.6 mg/dL (ref 0.3–1.2)
Total Protein: 7.8 g/dL (ref 6.5–8.1)

## 2022-09-11 LAB — CBC WITH DIFFERENTIAL/PLATELET
Abs Immature Granulocytes: 0.01 10*3/uL (ref 0.00–0.07)
Basophils Absolute: 0.1 10*3/uL (ref 0.0–0.1)
Basophils Relative: 1 %
Eosinophils Absolute: 0.2 10*3/uL (ref 0.0–0.5)
Eosinophils Relative: 3 %
HCT: 41 % (ref 36.0–46.0)
Hemoglobin: 12.9 g/dL (ref 12.0–15.0)
Immature Granulocytes: 0 %
Lymphocytes Relative: 43 %
Lymphs Abs: 2.9 10*3/uL (ref 0.7–4.0)
MCH: 24.4 pg — ABNORMAL LOW (ref 26.0–34.0)
MCHC: 31.5 g/dL (ref 30.0–36.0)
MCV: 77.7 fL — ABNORMAL LOW (ref 80.0–100.0)
Monocytes Absolute: 0.6 10*3/uL (ref 0.1–1.0)
Monocytes Relative: 8 %
Neutro Abs: 3.1 10*3/uL (ref 1.7–7.7)
Neutrophils Relative %: 45 %
Platelets: 357 10*3/uL (ref 150–400)
RBC: 5.28 MIL/uL — ABNORMAL HIGH (ref 3.87–5.11)
RDW: 14.3 % (ref 11.5–15.5)
WBC: 6.9 10*3/uL (ref 4.0–10.5)
nRBC: 0 % (ref 0.0–0.2)

## 2022-09-11 LAB — MAGNESIUM: Magnesium: 1.8 mg/dL (ref 1.7–2.4)

## 2022-09-11 LAB — CK: Total CK: 136 U/L (ref 38–234)

## 2022-09-11 MED ORDER — CYCLOBENZAPRINE HCL 10 MG PO TABS: 10.0000 mg | ORAL_TABLET | Freq: Two times a day (BID) | ORAL | 0 refills | Status: DC | PRN

## 2022-09-11 MED ORDER — LIDOCAINE 5 % EX PTCH: 1.0000 | MEDICATED_PATCH | CUTANEOUS | 0 refills | Status: DC

## 2022-09-11 NOTE — Discharge Instructions (Signed)
Your history, exam and workup today were overall reassuring.  The ultrasound not show evidence of any blood clots and the x-ray showed some arthritis but no evidence of acute other bony abnormalities.  Your exam and labs were not suggestive of infection.  Given the muscle tenderness and soreness and spasm I do suspect you have recurrent spasms.  Please continue the Flexeril and use the Lidoderm patch help with symptoms.  Please follow-up with sports medicine and your primary doctor.  If any symptoms change or worsen acutely, please turn to the nearest emergency department.

## 2022-09-11 NOTE — ED Notes (Signed)
Reviewed discharge instructions and medications with pt. Pt states understanding. Pt ambulatory for discharge

## 2022-09-11 NOTE — ED Notes (Signed)
Ambulatory to bathroom.

## 2022-09-11 NOTE — ED Triage Notes (Addendum)
Pt reports right lower leg swelling and muscle pain x 10 days. Pt reports taking HCTZ on sunday

## 2022-09-11 NOTE — ED Provider Notes (Signed)
[place] EMERGENCY DEPARTMENT AT [facility] [place] Provider Note   CSN: [id number] Arrival date & time: 09/11/22  [id number]     History  No chief complaint on file.   Melody Robles is a 52 y.o. female.  The history is provided by the patient and medical records. No language interpreter was used.  Leg Pain Location:  Leg and knee Time since incident:  1 week Injury: no   Leg location:  R upper leg and R leg Knee location:  R knee Pain details:    Quality:  Aching, cramping and dull   Radiates to:  Does not radiate   Severity:  Severe   Onset quality:  Gradual   Duration:  1 week   Timing:  Constant   Progression:  Waxing and waning Chronicity:  New Dislocation: no   Foreign body present:  No foreign bodies Associated symptoms: swelling   Associated symptoms: no back pain, no decreased ROM, no fatigue, no fever, no itching, no muscle weakness, no neck pain, no numbness, no stiffness and no tingling        Home Medications Prior to Admission medications   Medication Sig Start Date End Date Taking? Authorizing Provider  aspirin EC 81 MG tablet Take 1 tablet by mouth daily. 11/25/20 06/12/23  [provider]  atorvastatin (LIPITOR) 10 MG tablet Take by mouth. 06/12/22 06/12/23  [provider]  cefadroxil (DURICEF) 500 MG capsule Take 1 capsule (500 mg total) by mouth 2 (two) times daily. 08/28/22   Wilnette Kales, PA  cyclobenzaprine (FLEXERIL) 10 MG tablet Take 1 tablet (10 mg total) by mouth 2 (two) times daily as needed for muscle spasms. 08/28/22   Wilnette Kales, PA  hydrochlorothiazide (HYDRODIURIL) 12.5 MG tablet Take 1 tablet by mouth every morning. 06/12/22 06/12/23  [provider]  lisinopril (ZESTRIL) 20 MG tablet Take 1 tablet by mouth daily. 08/21/21 06/12/23  [provider]  metFORMIN (GLUCOPHAGE) 500 MG tablet Take by mouth 2 (two) times daily with a meal.    [provider]  metoprolol succinate  (TOPROL-XL) 25 MG 24 hr tablet Take 1 tablet by mouth daily. 08/21/21 06/12/23  [provider]      Allergies    Morphine and Benadryl [diphenhydramine]    Review of Systems   Review of Systems  Constitutional:  Negative for chills, fatigue and fever.  HENT:  Negative for congestion.   Respiratory:  Negative for cough, chest tightness, shortness of breath and wheezing.   Cardiovascular:  Positive for leg swelling (per pt). Negative for chest pain and palpitations.  Gastrointestinal:  Negative for abdominal pain, constipation, diarrhea, nausea and vomiting.  Genitourinary:  Negative for dysuria and flank pain.  Musculoskeletal:  Positive for myalgias. Negative for arthralgias, back pain, joint swelling, neck pain, neck stiffness and stiffness.  Skin:  Negative for itching, rash and wound.  Neurological:  Negative for headaches.  Psychiatric/Behavioral:  Negative for agitation and confusion.   All other systems reviewed and are negative.   Physical Exam Updated Vital Signs BP 117/65 (BP Location: Right Leg)   Pulse 98   Temp 97.8 F (36.6 C) (Oral)   Resp 18   Ht '5\' 6"'$  (1.676 m)   Wt 113.4 kg   SpO2 100%   BMI 40.35 kg/m  Physical Exam Vitals and nursing note reviewed.  Constitutional:      General: She is not in acute distress.    Appearance: She is well-developed. She is not  ill-appearing, toxic-appearing or diaphoretic.  HENT:     Head: Normocephalic and atraumatic.     Right Ear: External ear normal.     Left Ear: External ear normal.     Nose: Nose normal.     Mouth/Throat:     Mouth: Mucous membranes are moist.     Pharynx: No oropharyngeal exudate or posterior oropharyngeal erythema.  Eyes:     Conjunctiva/sclera: Conjunctivae normal.     Pupils: Pupils are equal, round, and reactive to light.  Cardiovascular:     Rate and Rhythm: Normal rate.  Pulmonary:     Effort: No respiratory distress.     Breath sounds: No stridor. No wheezing, rhonchi or  rales.  Chest:     Chest wall: No tenderness.  Abdominal:     General: Abdomen is flat. There is no distension.     Tenderness: There is no abdominal tenderness. There is no right CVA tenderness, left CVA tenderness, guarding or rebound.  Musculoskeletal:        General: Tenderness present. No signs of injury.     Cervical back: Normal range of motion and neck supple. No tenderness.     Right lower leg: No edema.     Left lower leg: No edema.       Legs:     Comments: Tenderness in right thigh and knee.  No pain with passive movement of the knee but pain with active movement.  No rashes seen.  Muscles are tender in the thigh.  No hip tenderness or ankle tenderness.  Good strength, sensation, and pulses.  Skin:    General: Skin is warm.     Coloration: Skin is not pale.     Findings: No bruising, erythema or rash.  Neurological:     General: No focal deficit present.     Mental Status: She is alert and oriented to person, place, and time.     Sensory: No sensory deficit.     Motor: No weakness or abnormal muscle tone.     Deep Tendon Reflexes: Reflexes are normal and symmetric.     ED Results / Procedures / Treatments   Labs (all labs ordered are listed, but only abnormal results are displayed) Labs Reviewed  CBC WITH DIFFERENTIAL/PLATELET - Abnormal; Notable for the following components:      Result Value   RBC 5.28 (*)    MCV 77.7 (*)    MCH 24.4 (*)    All other components within normal limits  COMPREHENSIVE METABOLIC PANEL - Abnormal; Notable for the following components:   Sodium 132 (*)    Glucose, Bld 126 (*)    Creatinine, Ser 1.09 (*)    Calcium 8.7 (*)    All other components within normal limits  CK  MAGNESIUM    EKG None  Radiology US Venous Img Lower Unilateral Right  Result Date: 09/11/2022 CLINICAL DATA:  right thigh and right leg pain, some knee pain. subjective r leg swelling EXAM: RIGHT LOWER EXTREMITY VENOUS DOPPLER ULTRASOUND TECHNIQUE:  Gray-scale sonography with compression, as well as color and duplex ultrasound, were performed to evaluate the deep venous system(s) from the level of the common femoral vein through the popliteal and proximal calf veins. COMPARISON:  None Available. FINDINGS: VENOUS Normal compressibility of the common femoral, superficial femoral, and popliteal veins, as well as the visualized calf veins. Visualized portions of profunda femoral vein and great saphenous vein unremarkable. No filling defects to suggest DVT on grayscale or color Doppler  imaging. Doppler waveforms show normal direction of venous flow, normal respiratory plasticity and response to augmentation. Limited views of the contralateral common femoral vein are unremarkable. IMPRESSION: No evidence of DVT in the right lower extremity. Electronically Signed   By: Margaretha Sheffield M.D.   On: 09/11/2022 10:48   DG Knee Complete 4 Views Right  Result Date: 09/11/2022 CLINICAL DATA:  Right thigh and leg pain.  Some knee pain. EXAM: RIGHT KNEE - COMPLETE 4+ VIEW; RIGHT FEMUR 2 VIEWS COMPARISON:  None Available. FINDINGS: Right femur: Minimal superomedial right femoroacetabular joint space narrowing. Minimal superolateral right acetabular degenerative osteophytosis. No acute fracture or dislocation. Right knee: Minimal medial compartment yes joint space narrowing and peripheral osteophytosis. No joint effusion. Minimal superior patellar degenerative osteophytosis. Likely at least mild patellofemoral joint space narrowing. No acute fracture or dislocation. No joint effusion. IMPRESSION: 1. Minimal right femoroacetabular osteoarthritis. 2. Minimal medial compartment and patellofemoral compartment osteoarthritis. Electronically Signed   By: Yvonne Kendall M.D.   On: 09/11/2022 10:29   DG Femur Min 2 Views Right  Result Date: 09/11/2022 CLINICAL DATA:  Right thigh and leg pain.  Some knee pain. EXAM: RIGHT KNEE - COMPLETE 4+ VIEW; RIGHT FEMUR 2 VIEWS COMPARISON:   None Available. FINDINGS: Right femur: Minimal superomedial right femoroacetabular joint space narrowing. Minimal superolateral right acetabular degenerative osteophytosis. No acute fracture or dislocation. Right knee: Minimal medial compartment yes joint space narrowing and peripheral osteophytosis. No joint effusion. Minimal superior patellar degenerative osteophytosis. Likely at least mild patellofemoral joint space narrowing. No acute fracture or dislocation. No joint effusion. IMPRESSION: 1. Minimal right femoroacetabular osteoarthritis. 2. Minimal medial compartment and patellofemoral compartment osteoarthritis. Electronically Signed   By: Yvonne Kendall M.D.   On: 09/11/2022 10:29    Procedures Procedures    Medications Ordered in ED Medications - No data to display  ED Course/ Medical Decision Making/ A&P                             Medical Decision Making Amount and/or Complexity of Data Reviewed Labs: ordered. Radiology: ordered.    LELANI CADWELL is a 52 y.o. female with a past medical history significant for hypertension and previous strokes who presents with right leg subjective edema and pain in the right thigh towards the knee.  Patient reports that 2 weeks ago she had muscle spasm in her right side and was put on Flexeril.  She reports she would take the medicine to help with symptoms on and off but she has run out of it and she is having more soreness and pain in her right thigh towards the knee.  She denies any new trauma.  She reports that she has had no urinary symptoms and now her urine that was quite dark before is now totally clear after finishing the antibiotics.  No urinary symptoms reported.  She denies any numbness or weakness.  Denies any rashes.  She denies history of DVT or PE but she did recently drive to [place] about 3 hours driving.  She thinks her leg is slightly swollen on the right.  No history of heart failure.  No chest pain, shortness of breath or  fatigue.  No palpitations.  No pain in the back.  No radicular symptoms.  On exam, lungs clear.  Chest nontender.  Abdomen nontender.  Back and flanks nontender.  Hip nontender.  Patient has tenderness and soreness in her thigh and somewhat in the  knee.  No pain with passive movement of the knee, doubt infection.  Pain is all with active movement of the muscles in her thigh.  No tenderness in the ankle or foot.  Intact sensation strength and pulses distally.  No edema seen but patient subjectively feels like her right leg is swollen.  Clinically I suspect she has muscle soreness and pain related to her recent spasm however we will get labs to look for electrolyte abnormality, will get a CK to look for rhabdo, and will get x-rays of the femur and thigh to rule out bony changes.  Will also get ultrasound given the suspected edema unilaterally with her recent travel.  If workup is reassuring, anticipate discharge with plans to follow-up with sports medicine or PCP as well as new muscle medication.  Given the lack of any passive knee pain with movement have low suspicion for septic arthritis and at this time do not feel needs to be aspirated.  Anticipate reassessment after workup.      Workup overall reassuring.  X-ray showed no acute bony injuries and no effusion.  Doubt infected knee.  Suspect muscle pain.  CK normal.  Labs otherwise reassuring as we discussed.  Suspect continued muscle soreness and spasm.  Patient amenable to getting prescription for muscle relaxant and Lidoderm patches and close follow-up with sports medicine and PCP.  She no other questions or concerns and was discharged in good condition after reassuring workup.         Final Clinical Impression(s) / ED Diagnoses Final diagnoses:  Right leg pain  Muscle pain  Muscle spasm    Rx / DC Orders ED Discharge Orders          Ordered    cyclobenzaprine (FLEXERIL) 10 MG tablet  2 times daily PRN        09/11/22 1215     lidocaine (LIDODERM) 5 %  Every 24 hours        09/11/22 1215            Clinical Impression: 1. Right leg pain   2. Muscle pain   3. Muscle spasm     Disposition: Discharge  Condition: Good  I have discussed the results, Dx and Tx plan with the pt(& family if present). He/she/they expressed understanding and agree(s) with the plan. Discharge instructions discussed at great length. Strict return precautions discussed and pt &/or family have verbalized understanding of the instructions. No further questions at time of discharge.    New Prescriptions   CYCLOBENZAPRINE (FLEXERIL) 10 MG TABLET    Take 1 tablet (10 mg total) by mouth 2 (two) times daily as needed for muscle spasms.   LIDOCAINE (LIDODERM) 5 %    Place 1 patch onto the skin daily. Remove & Discard patch within 12 hours or as directed by MD    Follow Up: Rosemarie Ax, MD [address] [place] [address] [place] [facility] at [facility] [address] [id number] [place] [phone]       Brynlie Daza, Gwenyth Allegra, MD 09/11/22 [phone]

## 2022-09-11 NOTE — ED Notes (Signed)
ED Provider at bedside. 

## 2022-09-16 ENCOUNTER — Ambulatory Visit: Payer: 59 | Admitting: Family Medicine

## 2022-10-29 ENCOUNTER — Encounter: Payer: Self-pay | Admitting: *Deleted

## 2023-01-03 ENCOUNTER — Observation Stay (HOSPITAL_COMMUNITY): Payer: 59

## 2023-01-03 ENCOUNTER — Inpatient Hospital Stay (HOSPITAL_COMMUNITY)
Admission: EM | Admit: 2023-01-03 | Discharge: 2023-01-06 | DRG: 065 | Disposition: A | Discharge status: Home / Self Care | Payer: 59 | Attending: Internal Medicine | Admitting: Internal Medicine

## 2023-01-03 ENCOUNTER — Emergency Department (HOSPITAL_COMMUNITY): Payer: 59

## 2023-01-03 ENCOUNTER — Other Ambulatory Visit: Payer: Self-pay

## 2023-01-03 ENCOUNTER — Encounter (HOSPITAL_COMMUNITY): Payer: Self-pay | Admitting: Emergency Medicine

## 2023-01-03 ENCOUNTER — Other Ambulatory Visit (HOSPITAL_COMMUNITY): Payer: 59

## 2023-01-03 ENCOUNTER — Encounter (HOSPITAL_COMMUNITY): Payer: 59

## 2023-01-03 DIAGNOSIS — I493 Ventricular premature depolarization: Secondary | ICD-10-CM | POA: Diagnosis present

## 2023-01-03 DIAGNOSIS — Z6841 Body Mass Index (BMI) 40.0 and over, adult: Secondary | ICD-10-CM

## 2023-01-03 DIAGNOSIS — N1831 Chronic kidney disease, stage 3a: Secondary | ICD-10-CM | POA: Diagnosis present

## 2023-01-03 DIAGNOSIS — F410 Panic disorder [episodic paroxysmal anxiety] without agoraphobia: Secondary | ICD-10-CM | POA: Diagnosis present

## 2023-01-03 DIAGNOSIS — I5022 Chronic systolic (congestive) heart failure: Secondary | ICD-10-CM | POA: Diagnosis present

## 2023-01-03 DIAGNOSIS — R29704 NIHSS score 4: Secondary | ICD-10-CM | POA: Diagnosis present

## 2023-01-03 DIAGNOSIS — Z7982 Long term (current) use of aspirin: Secondary | ICD-10-CM

## 2023-01-03 DIAGNOSIS — Z8673 Personal history of transient ischemic attack (TIA), and cerebral infarction without residual deficits: Secondary | ICD-10-CM | POA: Diagnosis present

## 2023-01-03 DIAGNOSIS — F32A Depression, unspecified: Secondary | ICD-10-CM | POA: Diagnosis present

## 2023-01-03 DIAGNOSIS — F17210 Nicotine dependence, cigarettes, uncomplicated: Secondary | ICD-10-CM | POA: Diagnosis present

## 2023-01-03 DIAGNOSIS — G8191 Hemiplegia, unspecified affecting right dominant side: Secondary | ICD-10-CM | POA: Diagnosis present

## 2023-01-03 DIAGNOSIS — I5020 Unspecified systolic (congestive) heart failure: Secondary | ICD-10-CM

## 2023-01-03 DIAGNOSIS — I130 Hypertensive heart and chronic kidney disease with heart failure and stage 1 through stage 4 chronic kidney disease, or unspecified chronic kidney disease: Secondary | ICD-10-CM | POA: Diagnosis present

## 2023-01-03 DIAGNOSIS — Z9049 Acquired absence of other specified parts of digestive tract: Secondary | ICD-10-CM

## 2023-01-03 DIAGNOSIS — E7800 Pure hypercholesterolemia, unspecified: Secondary | ICD-10-CM | POA: Diagnosis present

## 2023-01-03 DIAGNOSIS — Z592 Discord with neighbors, lodgers and landlord: Secondary | ICD-10-CM

## 2023-01-03 DIAGNOSIS — Z733 Stress, not elsewhere classified: Secondary | ICD-10-CM

## 2023-01-03 DIAGNOSIS — R4781 Slurred speech: Secondary | ICD-10-CM | POA: Diagnosis present

## 2023-01-03 DIAGNOSIS — Z885 Allergy status to narcotic agent status: Secondary | ICD-10-CM

## 2023-01-03 DIAGNOSIS — E119 Type 2 diabetes mellitus without complications: Secondary | ICD-10-CM

## 2023-01-03 DIAGNOSIS — I639 Cerebral infarction, unspecified: Secondary | ICD-10-CM | POA: Diagnosis present

## 2023-01-03 DIAGNOSIS — I63532 Cerebral infarction due to unspecified occlusion or stenosis of left posterior cerebral artery: Principal | ICD-10-CM | POA: Diagnosis present

## 2023-01-03 DIAGNOSIS — E669 Obesity, unspecified: Secondary | ICD-10-CM | POA: Diagnosis present

## 2023-01-03 DIAGNOSIS — E1122 Type 2 diabetes mellitus with diabetic chronic kidney disease: Secondary | ICD-10-CM | POA: Diagnosis present

## 2023-01-03 DIAGNOSIS — I472 Ventricular tachycardia, unspecified: Secondary | ICD-10-CM | POA: Diagnosis present

## 2023-01-03 DIAGNOSIS — Z833 Family history of diabetes mellitus: Secondary | ICD-10-CM

## 2023-01-03 DIAGNOSIS — I69351 Hemiplegia and hemiparesis following cerebral infarction affecting right dominant side: Secondary | ICD-10-CM

## 2023-01-03 DIAGNOSIS — Z79899 Other long term (current) drug therapy: Secondary | ICD-10-CM

## 2023-01-03 DIAGNOSIS — R531 Weakness: Secondary | ICD-10-CM | POA: Diagnosis not present

## 2023-01-03 DIAGNOSIS — Z8249 Family history of ischemic heart disease and other diseases of the circulatory system: Secondary | ICD-10-CM

## 2023-01-03 DIAGNOSIS — I340 Nonrheumatic mitral (valve) insufficiency: Secondary | ICD-10-CM | POA: Diagnosis present

## 2023-01-03 DIAGNOSIS — Z716 Tobacco abuse counseling: Secondary | ICD-10-CM

## 2023-01-03 DIAGNOSIS — I6350 Cerebral infarction due to unspecified occlusion or stenosis of unspecified cerebral artery: Principal | ICD-10-CM | POA: Diagnosis present

## 2023-01-03 DIAGNOSIS — I10 Essential (primary) hypertension: Secondary | ICD-10-CM | POA: Insufficient documentation

## 2023-01-03 DIAGNOSIS — I429 Cardiomyopathy, unspecified: Secondary | ICD-10-CM | POA: Diagnosis present

## 2023-01-03 DIAGNOSIS — E785 Hyperlipidemia, unspecified: Secondary | ICD-10-CM | POA: Insufficient documentation

## 2023-01-03 DIAGNOSIS — R29810 Facial weakness: Secondary | ICD-10-CM | POA: Diagnosis present

## 2023-01-03 LAB — CBC
HCT: 39.8 % (ref 36.0–46.0)
Hemoglobin: 12.5 g/dL (ref 12.0–15.0)
MCH: 25.4 pg — ABNORMAL LOW (ref 26.0–34.0)
MCHC: 31.4 g/dL (ref 30.0–36.0)
MCV: 80.9 fL (ref 80.0–100.0)
Platelets: 307 10*3/uL (ref 150–400)
RBC: 4.92 MIL/uL (ref 3.87–5.11)
RDW: 14.8 % (ref 11.5–15.5)
WBC: 6.6 10*3/uL (ref 4.0–10.5)
nRBC: 0 % (ref 0.0–0.2)

## 2023-01-03 LAB — DIFFERENTIAL
Abs Immature Granulocytes: 0.03 10*3/uL (ref 0.00–0.07)
Basophils Absolute: 0.1 10*3/uL (ref 0.0–0.1)
Basophils Relative: 1 %
Eosinophils Absolute: 0.2 10*3/uL (ref 0.0–0.5)
Eosinophils Relative: 3 %
Immature Granulocytes: 1 %
Lymphocytes Relative: 56 %
Lymphs Abs: 3.8 10*3/uL (ref 0.7–4.0)
Monocytes Absolute: 0.5 10*3/uL (ref 0.1–1.0)
Monocytes Relative: 7 %
Neutro Abs: 2.1 10*3/uL (ref 1.7–7.7)
Neutrophils Relative %: 32 %

## 2023-01-03 LAB — I-STAT CHEM 8, ED
BUN: 20 mg/dL (ref 6–20)
Calcium, Ion: 1.12 mmol/L — ABNORMAL LOW (ref 1.15–1.40)
Chloride: 110 mmol/L (ref 98–111)
Creatinine, Ser: 1.2 mg/dL — ABNORMAL HIGH (ref 0.44–1.00)
Glucose, Bld: 152 mg/dL — ABNORMAL HIGH (ref 70–99)
HCT: 43 % (ref 36.0–46.0)
Hemoglobin: 14.6 g/dL (ref 12.0–15.0)
Potassium: 3.9 mmol/L (ref 3.5–5.1)
Sodium: 140 mmol/L (ref 135–145)
TCO2: 19 mmol/L — ABNORMAL LOW (ref 22–32)

## 2023-01-03 LAB — COMPREHENSIVE METABOLIC PANEL
ALT: 30 U/L (ref 0–44)
AST: 23 U/L (ref 15–41)
Albumin: 3.4 g/dL — ABNORMAL LOW (ref 3.5–5.0)
Alkaline Phosphatase: 82 U/L (ref 38–126)
Anion gap: 14 (ref 5–15)
BUN: 18 mg/dL (ref 6–20)
CO2: 18 mmol/L — ABNORMAL LOW (ref 22–32)
Calcium: 8.9 mg/dL (ref 8.9–10.3)
Chloride: 105 mmol/L (ref 98–111)
Creatinine, Ser: 1.35 mg/dL — ABNORMAL HIGH (ref 0.44–1.00)
GFR, Estimated: 48 mL/min — ABNORMAL LOW (ref >60)
Glucose, Bld: 158 mg/dL — ABNORMAL HIGH (ref 70–99)
Potassium: 3.9 mmol/L (ref 3.5–5.1)
Sodium: 137 mmol/L (ref 135–145)
Total Bilirubin: 0.5 mg/dL (ref 0.3–1.2)
Total Protein: 7.1 g/dL (ref 6.5–8.1)

## 2023-01-03 LAB — APTT: aPTT: 22 seconds — ABNORMAL LOW (ref 24–36)

## 2023-01-03 LAB — LIPID PANEL
Cholesterol: 241 mg/dL — ABNORMAL HIGH (ref 0–200)
HDL: 39 mg/dL — ABNORMAL LOW (ref >40)
LDL Cholesterol: 171 mg/dL — ABNORMAL HIGH (ref 0–99)
Total CHOL/HDL Ratio: 6.2 RATIO
Triglycerides: 153 mg/dL — ABNORMAL HIGH (ref <150)
VLDL: 31 mg/dL (ref 0–40)

## 2023-01-03 LAB — PROTIME-INR
INR: 1 (ref 0.8–1.2)
Prothrombin Time: 13.3 seconds (ref 11.4–15.2)

## 2023-01-03 LAB — ETHANOL: Alcohol, Ethyl (B): <10 mg/dL (ref <10)

## 2023-01-03 LAB — HEMOGLOBIN A1C
Hgb A1c MFr Bld: 8 % — ABNORMAL HIGH (ref 4.8–5.6)
Mean Plasma Glucose: 182.9 mg/dL

## 2023-01-03 LAB — CBG MONITORING, ED: Glucose-Capillary: 148 mg/dL — ABNORMAL HIGH (ref 70–99)

## 2023-01-03 MED ORDER — ENOXAPARIN SODIUM 40 MG/0.4ML IJ SOSY
40.0000 mg | PREFILLED_SYRINGE | INTRAMUSCULAR | Status: DC
  Administered 2023-01-03 – 2023-01-05 (×2): 40 mg via SUBCUTANEOUS
  Filled 2023-01-03 (×2): qty 0.4

## 2023-01-03 MED ORDER — ATORVASTATIN CALCIUM 40 MG PO TABS
40.0000 mg | ORAL_TABLET | Freq: Every day | ORAL | Status: DC
  Administered 2023-01-03 – 2023-01-06 (×4): 40 mg via ORAL
  Filled 2023-01-03 (×4): qty 1

## 2023-01-03 MED ORDER — ATORVASTATIN CALCIUM 40 MG PO TABS: 80.0000 mg | ORAL_TABLET | Freq: Every day | ORAL | Status: DC

## 2023-01-03 MED ORDER — LORAZEPAM 2 MG/ML IJ SOLN
1.0000 mg | Freq: Once | INTRAMUSCULAR | Status: AC
  Administered 2023-01-03: 1 mg via INTRAVENOUS
  Filled 2023-01-03: qty 1

## 2023-01-03 MED ORDER — METOPROLOL SUCCINATE ER 25 MG PO TB24
25.0000 mg | ORAL_TABLET | Freq: Every day | ORAL | Status: DC
  Administered 2023-01-03 – 2023-01-04 (×2): 25 mg via ORAL
  Filled 2023-01-03 (×2): qty 1

## 2023-01-03 MED ORDER — ACETAMINOPHEN 325 MG PO TABS: 650.0000 mg | ORAL_TABLET | Freq: Four times a day (QID) | ORAL | Status: DC | PRN

## 2023-01-03 MED ORDER — POLYETHYLENE GLYCOL 3350 17 G PO PACK
17.0000 g | PACK | Freq: Every day | ORAL | Status: DC | PRN
  Administered 2023-01-05: 17 g via ORAL
  Filled 2023-01-03: qty 1

## 2023-01-03 MED ORDER — ASPIRIN 300 MG RE SUPP: 300.0000 mg | Freq: Every day | RECTAL | Status: DC

## 2023-01-03 MED ORDER — CLOPIDOGREL BISULFATE 75 MG PO TABS
75.0000 mg | ORAL_TABLET | Freq: Every day | ORAL | Status: DC
  Administered 2023-01-03 – 2023-01-06 (×4): 75 mg via ORAL
  Filled 2023-01-03 (×4): qty 1

## 2023-01-03 MED ORDER — ACETAMINOPHEN 650 MG RE SUPP: 650.0000 mg | Freq: Four times a day (QID) | RECTAL | Status: DC | PRN

## 2023-01-03 MED ORDER — ASPIRIN 81 MG PO CHEW
81.0000 mg | CHEWABLE_TABLET | Freq: Every day | ORAL | Status: DC
  Administered 2023-01-03 – 2023-01-06 (×4): 81 mg via ORAL
  Filled 2023-01-03 (×4): qty 1

## 2023-01-03 MED ORDER — GADOBUTROL 1 MMOL/ML IV SOLN
10.0000 mL | Freq: Once | INTRAVENOUS | Status: AC | PRN
  Administered 2023-01-03: 10 mL via INTRAVENOUS

## 2023-01-03 MED ORDER — IOHEXOL 350 MG/ML SOLN
75.0000 mL | Freq: Once | INTRAVENOUS | Status: AC | PRN
  Administered 2023-01-03: 75 mL via INTRAVENOUS
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
## 2023-01-03 NOTE — ED Notes (Signed)
Patient transported to CT 
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
## 2023-01-03 NOTE — ED Notes (Signed)
Pt stated she can't take medication on an empty stomach. Pt breakfast tray was ordered about 30 minutes ago and should arrive shortly.

## 2023-01-03 NOTE — ED Triage Notes (Signed)
Pt in as code stroke via [facility] with reported slurred speech and R sided weakness. LSN [id number]

## 2023-01-03 NOTE — ED Provider Notes (Signed)
[place] EMERGENCY DEPARTMENT AT [facility] Provider Note   CSN: [id number] Arrival date & time: 01/03/23  2956  An emergency department physician performed an initial assessment on this suspected stroke patient at 0326.  History  Chief Complaint  Patient presents with   Code Stroke    Melody Robles is a 52 y.o. female.  52 yo F with a cc of right sided facial droop and slurred speech.  This started acutely.  Patient arrived as a code stroke.  Level 5 caveat acuity condition.        Home Medications Prior to Admission medications   Medication Sig Start Date End Date Taking? Authorizing Provider  aspirin EC 81 MG tablet Take 1 tablet by mouth daily. 11/25/20 06/12/23 Yes [provider]  lidocaine (LIDODERM) 5 % Place 1 patch onto the skin daily. Remove & Discard patch within 12 hours or as directed by MD 09/11/22  Yes Small, Brooke L, PA  lisinopril (ZESTRIL) 20 MG tablet Take 1 tablet by mouth daily. 08/21/21 06/12/23 Yes [provider]  metoprolol succinate (TOPROL-XL) 25 MG 24 hr tablet Take 1 tablet by mouth daily. 08/21/21 06/12/23 Yes [provider]  atorvastatin (LIPITOR) 10 MG tablet Take by mouth. Patient not taking: Reported on 01/03/2023 06/12/22 06/12/23  [provider]  hydrochlorothiazide (HYDRODIURIL) 12.5 MG tablet Take 1 tablet by mouth every morning. Patient not taking: Reported on 01/03/2023 06/12/22 06/12/23  [provider]  metFORMIN (GLUCOPHAGE) 500 MG tablet Take by mouth 2 (two) times daily with a meal. Patient not taking: Reported on 01/03/2023    [provider]      Allergies    Benadryl [diphenhydramine] and Morphine    Review of Systems   Review of Systems  Physical Exam Updated Vital Signs BP 116/80   Pulse 98   Resp 14   Wt 113.4 kg   SpO2 100%   BMI 40.35 kg/m  Physical Exam Vitals and nursing note reviewed.  Constitutional:      General: She is not in acute  distress.    Appearance: She is well-developed. She is not diaphoretic.  HENT:     Head: Normocephalic and atraumatic.  Eyes:     Pupils: Pupils are equal, round, and reactive to light.  Cardiovascular:     Rate and Rhythm: Normal rate and regular rhythm.     Heart sounds: No murmur heard.    No friction rub. No gallop.  Pulmonary:     Effort: Pulmonary effort is normal.     Breath sounds: No wheezing or rales.  Abdominal:     General: There is no distension.     Palpations: Abdomen is soft.     Tenderness: There is no abdominal tenderness.  Musculoskeletal:        General: No tenderness.     Cervical back: Normal range of motion and neck supple.  Skin:    General: Skin is warm and dry.  Neurological:     Mental Status: She is alert and oriented to person, place, and time.  Psychiatric:        Behavior: Behavior normal.     ED Results / Procedures / Treatments   Labs (all labs ordered are listed, but only abnormal results are displayed) Labs Reviewed  APTT - Abnormal; Notable for the following components:      Result Value   aPTT 22 (*)    All other components within normal limits  CBC - Abnormal; Notable  for the following components:   MCH 25.4 (*)    All other components within normal limits  COMPREHENSIVE METABOLIC PANEL - Abnormal; Notable for the following components:   CO2 18 (*)    Glucose, Bld 158 (*)    Creatinine, Ser 1.35 (*)    Albumin 3.4 (*)    GFR, Estimated 48 (*)    All other components within normal limits  CBG MONITORING, ED - Abnormal; Notable for the following components:   Glucose-Capillary 148 (*)    All other components within normal limits  I-STAT CHEM 8, ED - Abnormal; Notable for the following components:   Creatinine, Ser 1.20 (*)    Glucose, Bld 152 (*)    Calcium, Ion 1.12 (*)    TCO2 19 (*)    All other components within normal limits  ETHANOL  PROTIME-INR  DIFFERENTIAL  RAPID URINE DRUG SCREEN, HOSP PERFORMED  URINALYSIS,  ROUTINE W REFLEX MICROSCOPIC    EKG EKG Interpretation  Date/Time:  Sunday January 03 2023 04:13:35 EDT Ventricular Rate:  97 PR Interval:  171 QRS Duration: 100 QT Interval:  379 QTC Calculation: 482 R Axis:   42 Text Interpretation: Sinus rhythm Multiform ventricular premature complexes Minimal ST elevation, anterior leads No significant change since last tracing Confirmed by Melene Plan [phone]) on 01/03/2023 6:12:14 AM  Radiology MR BRAIN W CONTRAST  Result Date: 01/03/2023 CLINICAL DATA:  Headache.  Abnormal left temporal lobe. EXAM: MRI HEAD WITH CONTRAST TECHNIQUE: Multiplanar, multiecho pulse sequences of the brain and surrounding structures were obtained with intravenous contrast. CONTRAST:  10mL GADAVIST GADOBUTROL 1 MMOL/ML IV SOLN COMPARISON:  Noncontrast brain MRI from earlier today FINDINGS: The anterior and lateral left temporal cortex abnormality is T1 hyperintense by prior brain MRI and shows cortically based enhancement. On T2 weighted imaging there is suggestion of early volume loss and subacute infarct is favored. There is somewhat unusual extension into the subinsular white matter and follow-up is recommended in this young patient to exclude mass or inflammatory process. No other areas of abnormal enhancement. IMPRESSION: T1 hyperintense and enhancing cortex at the left temporal lobe abnormality, favor subacute infarct. Follow-up with contrast in approximately 2 months is recommended to confirm resolution. Electronically Signed   By: Tiburcio Pea M.D.   On: 01/03/2023 06:58   MR BRAIN WO CONTRAST  Result Date: 01/03/2023 CLINICAL DATA:  Neuro deficit with acute stroke suspected EXAM: MRI HEAD WITHOUT CONTRAST TECHNIQUE: Multiplanar, multiecho pulse sequences of the brain and surrounding structures were obtained without intravenous contrast. COMPARISON:  Head CT from earlier today in brain MRI 10/03/2019 FINDINGS: Brain: No acute infarction, hemorrhage, hydrocephalus,  extra-axial collection or mass lesion. A small diffusion hyperintense focus in the left corona radiata demonstrates shine through on ADC map. There is a small area of new FLAIR hyperintensity with linear T1 hyperintensity along the superficial left temporal lobe with volume loss best seen on axial T2 weighted imaging, consistent with encephalomalacia. This could be posttraumatic or post ischemic in this location, minimal chronic blood products suggested on gradient imaging. Few tiny FLAIR hyperintensities in the cerebral white matter with nonspecific pattern. Brain volume is normal. Vascular: Normal flow voids. Skull and upper cervical spine: Normal marrow signal. Sinuses/Orbits: Minimal left mastoid opacification. Negative orbits. IMPRESSION: 1. No acute finding. 2. Small area of cortical encephalomalacia in the left temporal lobe since 2021 3. Mild white matter disease with nonspecific pattern, history of hypertension favoring chronic microvascular ischemia. Electronically Signed   By: Audry Riles.D.  On: 01/03/2023 04:32   CT HEAD CODE STROKE WO CONTRAST  Result Date: 01/03/2023 CLINICAL DATA:  Code stroke. Acute neurologic deficit. Slurred speech and right-sided weakness. EXAM: CT HEAD WITHOUT CONTRAST TECHNIQUE: Contiguous axial images were obtained from the base of the skull through the vertex without intravenous contrast. RADIATION DOSE REDUCTION: This exam was performed according to the departmental dose-optimization program which includes automated exposure control, adjustment of the mA and/or kV according to patient size and/or use of iterative reconstruction technique. COMPARISON:  None Available. FINDINGS: Brain: There is no mass, hemorrhage or extra-axial collection. The size and configuration of the ventricles and extra-axial CSF spaces are normal. The brain parenchyma is normal, without evidence of acute or chronic infarction. Vascular: No abnormal hyperdensity of the major intracranial  arteries or dural venous sinuses. No intracranial atherosclerosis. Skull: The visualized skull base, calvarium and extracranial soft tissues are normal. Sinuses/Orbits: No fluid levels or advanced mucosal thickening of the visualized paranasal sinuses. No mastoid or middle ear effusion. The orbits are normal. ASPECTS [facility] Stroke Program Early CT Score) - Ganglionic level infarction (caudate, lentiform nuclei, internal capsule, insula, M1-M3 cortex): 7 - Supraganglionic infarction (M4-M6 cortex): 3 Total score (0-10 with 10 being normal): 10 IMPRESSION: 1. No acute intracranial abnormality. 2. ASPECTS is 10. These results were communicated to Dr. Erick Blinks at 3:39 am on 01/03/2023 by text page via the [facility] messaging system. Electronically Signed   By: Deatra Robinson M.D.   On: 01/03/2023 03:40    Procedures Procedures    Medications Ordered in ED Medications  LORazepam (ATIVAN) injection 1 mg (1 mg Intravenous Given 01/03/23 0517)  gadobutrol (GADAVIST) 1 MMOL/ML injection 10 mL (10 mLs Intravenous Contrast Given 01/03/23 8295)    ED Course/ Medical Decision Making/ A&P                             Medical Decision Making Amount and/or Complexity of Data Reviewed Labs: ordered.  Risk Prescription drug management.   52 yo F with a cc of slurred speech and right-sided weakness.  Arrived as a code stroke.  Airway cleared at the bridge and taken urgently back to CT.  CT scan without any cranial hemorrhage on my independent interpretation.  She was taken urgently back to MRI.  Initial read concerning for possible subacute stroke.  Recommending MRI with contrast.  On further review neurology feels like a subacute stroke is much older.  Felt that if MRI with contrast was unremarkable that she could follow-up with neurology as an outpatient.  No significant electrolyte abnormalities.  No significant anemia.  EtOH negative.  MR with contrast also concerning for stroke.  Dr. Derry Lory  recommends medical admission.   The patients results and plan were reviewed and discussed.   Any x-rays performed were independently reviewed by myself.   Differential diagnosis were considered with the presenting HPI.  Medications  LORazepam (ATIVAN) injection 1 mg (1 mg Intravenous Given 01/03/23 0517)  gadobutrol (GADAVIST) 1 MMOL/ML injection 10 mL (10 mLs Intravenous Contrast Given 01/03/23 0644)    Vitals:   01/03/23 0330 01/03/23 0430 01/03/23 0500 01/03/23 0530  BP: 139/85 (!) 114/90 (!) 123/98 116/80  Pulse: (!) 101 91 93 98  Resp:  17 14   SpO2: 100% 98% 100% 100%  Weight:        Final diagnoses:  Right sided weakness    Admission/ observation were discussed with the admitting physician, patient and/or family  and they are comfortable with the plan.          Final Clinical Impression(s) / ED Diagnoses Final diagnoses:  Right sided weakness    Rx / DC Orders ED Discharge Orders     None         Melene Plan, DO 01/03/23 [phone]

## 2023-01-03 NOTE — ED Notes (Signed)
Pt was assisted to the restroom with a wheelchair to freshen up. Pt was given toothbrush, toothpaste, bodywash, deodorant, mesh underwear with pads and towels.  Pt linens were changed and belongings placed in pt bag/.
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
## 2023-01-03 NOTE — ED Notes (Signed)
Pt complaining of sacral pain due to lying on her bottom too long, patient turned on side and pain started to decrease.

## 2023-01-03 NOTE — ED Notes (Signed)
Called bed placement, no neuro beds. Asked Diplomatic Services operational officer to page for a hospital bed.

## 2023-01-03 NOTE — ED Provider Notes (Signed)
  Physical Exam  BP 116/80   Pulse 98   Resp 14   Wt 113.4 kg   SpO2 100%   BMI 40.35 kg/m   Physical Exam  Procedures  Procedures  ED Course / MDM    Medical Decision Making Amount and/or Complexity of Data Reviewed Labs: ordered.  Risk Prescription drug management.   52 yo female lkn 2 am Presented as code stroke with right side weakness Initiated as code stroke Symptoms improved MRI with subacute stroke of left temporal Neurology is not proceeding with thrombolytics based on subacute read Advises admission for stroke work up       Margarita Grizzle, MD 01/04/23 [phone]

## 2023-01-03 NOTE — Hospital Course (Signed)
  6/24: didn't sleep well last night. Questions about not taking lisinopril since being here. Thinks her weakness is much better. Over last week only eating 2 nabs and taking meds, minimally getting oob. 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 Hasgotten up and walked and has done fine with it. Son and daughter who are with her at all times at home have been avail for assist as needed. Has noticed over last 1wk limp and toe dragging. This ahppened with her first strokes.

## 2023-01-03 NOTE — ED Notes (Signed)
Patient unable to tolerate Mri scans with contrast, neurologist notified in person.

## 2023-01-03 NOTE — ED Notes (Signed)
Pt inquired about her Lisinopril medication. The provider states it will be held at this time.

## 2023-01-04 ENCOUNTER — Encounter (HOSPITAL_COMMUNITY): Payer: Self-pay | Admitting: Internal Medicine

## 2023-01-04 ENCOUNTER — Observation Stay (HOSPITAL_COMMUNITY): Payer: 59

## 2023-01-04 DIAGNOSIS — I129 Hypertensive chronic kidney disease with stage 1 through stage 4 chronic kidney disease, or unspecified chronic kidney disease: Secondary | ICD-10-CM | POA: Diagnosis not present

## 2023-01-04 DIAGNOSIS — R531 Weakness: Principal | ICD-10-CM

## 2023-01-04 DIAGNOSIS — E1122 Type 2 diabetes mellitus with diabetic chronic kidney disease: Secondary | ICD-10-CM

## 2023-01-04 DIAGNOSIS — E785 Hyperlipidemia, unspecified: Secondary | ICD-10-CM | POA: Diagnosis not present

## 2023-01-04 DIAGNOSIS — Z87891 Personal history of nicotine dependence: Secondary | ICD-10-CM

## 2023-01-04 DIAGNOSIS — I639 Cerebral infarction, unspecified: Secondary | ICD-10-CM | POA: Diagnosis not present

## 2023-01-04 DIAGNOSIS — I63532 Cerebral infarction due to unspecified occlusion or stenosis of left posterior cerebral artery: Secondary | ICD-10-CM | POA: Diagnosis not present

## 2023-01-04 DIAGNOSIS — Z7984 Long term (current) use of oral hypoglycemic drugs: Secondary | ICD-10-CM

## 2023-01-04 DIAGNOSIS — N1831 Chronic kidney disease, stage 3a: Secondary | ICD-10-CM

## 2023-01-04 DIAGNOSIS — I6389 Other cerebral infarction: Secondary | ICD-10-CM | POA: Diagnosis not present

## 2023-01-04 DIAGNOSIS — I255 Ischemic cardiomyopathy: Secondary | ICD-10-CM | POA: Diagnosis not present

## 2023-01-04 LAB — CBC
HCT: 38.7 % (ref 36.0–46.0)
Hemoglobin: 12 g/dL (ref 12.0–15.0)
MCH: 25.5 pg — ABNORMAL LOW (ref 26.0–34.0)
MCHC: 31 g/dL (ref 30.0–36.0)
MCV: 82.2 fL (ref 80.0–100.0)
Platelets: 298 10*3/uL (ref 150–400)
RBC: 4.71 MIL/uL (ref 3.87–5.11)
RDW: 14.4 % (ref 11.5–15.5)
WBC: 5.7 10*3/uL (ref 4.0–10.5)
nRBC: 0 % (ref 0.0–0.2)

## 2023-01-04 LAB — ECHOCARDIOGRAM COMPLETE BUBBLE STUDY
MV M vel: 5.58 m/s
MV Peak grad: 124.5 mmHg
S' Lateral: 6.36 cm
Single Plane A2C EF: 29.3 %

## 2023-01-04 LAB — BASIC METABOLIC PANEL
Anion gap: 9 (ref 5–15)
BUN: 16 mg/dL (ref 6–20)
CO2: 21 mmol/L — ABNORMAL LOW (ref 22–32)
Calcium: 8.7 mg/dL — ABNORMAL LOW (ref 8.9–10.3)
Chloride: 107 mmol/L (ref 98–111)
Creatinine, Ser: 1.14 mg/dL — ABNORMAL HIGH (ref 0.44–1.00)
GFR, Estimated: 59 mL/min — ABNORMAL LOW (ref >60)
Glucose, Bld: 110 mg/dL — ABNORMAL HIGH (ref 70–99)
Potassium: 3.8 mmol/L (ref 3.5–5.1)
Sodium: 137 mmol/L (ref 135–145)

## 2023-01-04 LAB — HIV ANTIBODY (ROUTINE TESTING W REFLEX): HIV Screen 4th Generation wRfx: NONREACTIVE

## 2023-01-04 MED ORDER — METOPROLOL SUCCINATE ER 25 MG PO TB24
25.0000 mg | ORAL_TABLET | Freq: Every day | ORAL | Status: DC
  Administered 2023-01-05: 25 mg via ORAL
  Filled 2023-01-04: qty 1

## 2023-01-04 MED ORDER — LORAZEPAM 1 MG PO TABS
1.0000 mg | ORAL_TABLET | Freq: Once | ORAL | Status: AC | PRN
  Administered 2023-01-05: 1 mg via ORAL
  Filled 2023-01-04: qty 1

## 2023-01-04 MED ORDER — PERFLUTREN LIPID MICROSPHERE
1.0000 mL | INTRAVENOUS | Status: AC | PRN
  Administered 2023-01-04: 6 mL via INTRAVENOUS

## 2023-01-04 MED ORDER — LISINOPRIL 20 MG PO TABS
20.0000 mg | ORAL_TABLET | Freq: Every day | ORAL | Status: DC
  Administered 2023-01-04: 20 mg via ORAL
  Filled 2023-01-04: qty 1

## 2023-01-04 NOTE — Progress Notes (Signed)
  Echocardiogram 2D Echocardiogram has been performed.  Melody Robles 01/04/2023, 12:02 PM
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
## 2023-01-04 NOTE — Progress Notes (Addendum)
HD#0 Subjective:   Summary: This is a 52 year old female with prior CVA who presents to the emergency department with right upper and lower extremity weakness.  Patient admitted for subacute left temporal lobe CVA.  Overnight Events: No overnight events  Patient evaluated bedside this morning.  She states she is doing well.  She has much improved strength to her right side.  She denies any trouble speaking or swallowing.  She states she is improving.  She does report being stressed out.  After talking with neurology, patient is planning to stay for TEE inpatient.  Objective:  Vital signs in last 24 hours: Vitals:   01/04/23 0600 01/04/23 0630 01/04/23 0700 01/04/23 0732  BP: 109/70 112/74 112/73   Pulse: 80 80 85   Resp: 12 12 14    Temp:    97.8 F (36.6 C)  TempSrc:    Oral  SpO2: 98% 98% 97%   Weight:       Supplemental O2: Room Air SpO2: 97 %   Physical Exam:  Constitutional: Resting in bed, no acute distress HENT: normocephalic atraumatic[ Eyes: EOMI Cardiovascular: regular rate and rhythm, no m/r/g Pulmonary/Chest: normal work of breathing on room air, lungs clear to auscultation bilaterally Neurological: 4/5 strength noted to right upper and lower extremity.  3/2 strength noted to right lower extremity on dorsiflexion and plantarflexion.  Sensation intact.  Filed Weights   01/03/23 0328  Weight: 113.4 kg    No intake or output data in the 24 hours ending 01/04/23 0741 Net IO Since Admission: No IO data has been entered for this period [01/04/23 0741]  Pertinent Labs:    Latest Ref Rng & Units 01/04/2023   12:48 AM 01/03/2023    3:32 AM 01/03/2023    3:31 AM  CBC  WBC 4.0 - 10.5 K/uL 5.7   6.6   Hemoglobin 12.0 - 15.0 g/dL 82.9  93.7  16.9   Hematocrit 36.0 - 46.0 % 38.7  43.0  39.8   Platelets 150 - 400 K/uL 298   307        Latest Ref Rng & Units 01/04/2023   12:48 AM 01/03/2023    3:32 AM 01/03/2023    3:31 AM  CMP  Glucose 70 - 99 mg/dL 678  938   101   BUN 6 - 20 mg/dL 16  20  18    Creatinine 0.44 - 1.00 mg/dL 7.51  0.25  8.52   Sodium 135 - 145 mmol/L 137  140  137   Potassium 3.5 - 5.1 mmol/L 3.8  3.9  3.9   Chloride 98 - 111 mmol/L 107  110  105   CO2 22 - 32 mmol/L 21   18   Calcium 8.9 - 10.3 mg/dL 8.7   8.9   Total Protein 6.5 - 8.1 g/dL   7.1   Total Bilirubin 0.3 - 1.2 mg/dL   0.5   Alkaline Phos 38 - 126 U/L   82   AST 15 - 41 U/L   23   ALT 0 - 44 U/L   30     Imaging: CT ANGIO HEAD NECK W WO CM  Result Date: 01/03/2023 CLINICAL DATA:  Stroke suspected, determine embolic source EXAM: CT ANGIOGRAPHY HEAD AND NECK WITH AND WITHOUT CONTRAST TECHNIQUE: Multidetector CT imaging of the head and neck was performed using the standard protocol during bolus administration of intravenous contrast. Multiplanar CT image reconstructions and MIPs were obtained to evaluate the vascular anatomy. Carotid stenosis  measurements (when applicable) are obtained utilizing NASCET criteria, using the distal internal carotid diameter as the denominator. RADIATION DOSE REDUCTION: This exam was performed according to the departmental dose-optimization program which includes automated exposure control, adjustment of the mA and/or kV according to patient size and/or use of iterative reconstruction technique. CONTRAST:  75mL OMNIPAQUE IOHEXOL 350 MG/ML SOLN COMPARISON:  Brain MRI from earlier today FINDINGS: CTA NECK FINDINGS Aortic arch: Normal with 3 vessel branching. Right carotid system: Vessels are smoothly contoured and widely patent without atherosclerosis. Left carotid system: Vessels are smoothly contoured and widely patent without atherosclerosis. Vertebral arteries: Vessels are smoothly contoured and widely patent without atherosclerosis. Strong right vertebral artery dominance. Skeleton: Ordinary cervical spine degeneration. Other neck: Unremarkable Upper chest: Clear apical lungs Review of the MIP images confirms the above findings CTA HEAD FINDINGS  Anterior circulation: No significant stenosis, proximal occlusion, aneurysm, or vascular malformation. Posterior circulation: Strong right vertebral artery dominance. Vessels are smoothly contoured and diffusely patent. No branch occlusion or aneurysm Venous sinuses: Normal Anatomic variants: None significant Review of the MIP images confirms the above findings IMPRESSION: Normal CTA of the head and neck. Electronically Signed   By: Tiburcio Pea M.D.   On: 01/03/2023 09:54    Assessment/Plan:   Principal Problem:   Acute CVA (cerebrovascular accident) (HCC) Active Problems:   HTN (hypertension)   HLD (hyperlipidemia)   Type 2 diabetes mellitus (HCC)   Patient Summary: #Subacute CVA of L temporal lobe #History of previous CVA Patient evaluated this morning at bedside.  Patient strength is much improved.  Neurology following, who is also thinking this could be related to panic disorders.  Given young age, they did order hypercoagulable workup.  Yes or concerned about possible cardioembolic source given both of her strokes and rapid at times of stress.  During hospital stay, patient did have PVCs on telemetry.  When cardiologist placed monitor.  Will also evaluate with TEE.  TTE has been done, but not read at this time. -Continue atorvastatin 40 mg daily -On discharge can start patient with metformin -Neurology following, appreciate recommendations -PT/OT following -TEE TEE pending -TTE read pending -Monitor for further neurologic deficits -Continue aspirin and Plavix day 2 of 21, followed by Plavix alone  #Hypertension Blood pressure measuring well at this moment.  Will change from metoprolol succinate to lisinopril.   -Hold metoprolol succinate 25 mg daily -Add back lisinopril 20 mg daily  -Monitor blood pressure closely  #HLD LDL above goal. -Continue atorvastatin 40 mg daily  -Encourage patient to be compliant   #Type 2 diabetes mellitus A1c resulted at 8.0. -Will start  patient on metformin outpatient  #CKD stage IIIa On further chart review, patient does seem to have elevated creatinine at baseline around 1.1-1.2.  GFR in the mid to low 50s.  Patient could have component of CKD stage IIIa likely in setting of hypertension and diabetes. -Creatinine baseline at this moment -Continue monitoring function -Resume home ACE inhibitor lisinopril 20 mg daily  Diet: Normal IVF: None,None VTE: Enoxaparin Code: Full PT/OT recs: Pending, none. Family Update: Son and daughter updated at bedside  Dispo: Anticipated discharge to Home in 3 days pending TEE and clinical improvement.   Modena Slater DO Internal Medicine Resident PGY-1 [phone] Please contact the on call pager after 5 pm and on weekends at [phone].

## 2023-01-04 NOTE — Progress Notes (Signed)
Patient admitted to 3W room 304 from ED. SBAR/chart reviewed prior to arrival. Patient oriented to room, patient verbalized understanding and in agreement. VSS. RA. Respirations are even and unlabored. NAD. Patient denies CP or SOB. Telemetry/cardiac monitor applied, per order. PIV intact. Nursing assessment completed. Call bell and menu handed to patient. Bed in lowest position and locked.

## 2023-01-04 NOTE — Evaluation (Signed)
Physical Therapy Evaluation Patient Details Name: Melody Robles MRN: [id number] DOB: 1970/07/18 Today's Date: 01/04/2023  History of Present Illness  52 y.o. F with, who presented to [facility] ED 6/23 with R sided weakness and facial droop, found to have subacute L temporal lobe CVA.  PMH multiple CVA, HTN, HLD, pre-DM, depression  Clinical Impression  PTA pt living with family in single story apartment. Pt was working with [employer] until June 7th completely independent. Since then pt with decreasing mobilization. Pt is currently limited in safe mobility by R sided weakness from 2012 CVA, in presence of dizziness, and fatigue. Dizziness may be related to BP changes with positional change (see below) Pt currently mod I for bed mobility and min guard for transfers and min guard for ambulation with RW. Pt would benefit from post acute PT to improve strength and endurance. PT will continue to follow acutely.   Orthostatic BPs  Supine 131/67  Sitting 117/94  Standing 121/102  After ambulation  135/82      Recommendations for follow up therapy are one component of a multi-disciplinary discharge planning process, led by the attending physician.  Recommendations may be updated based on patient status, additional functional criteria and insurance authorization.     Assistance Recommended at Discharge Frequent or constant Supervision/Assistance  Patient can return home with the following  A little help with walking and/or transfers;A little help with bathing/dressing/bathroom;Assist for transportation    Equipment Recommendations Rolling walker (2 wheels)     Functional Status Assessment Patient has had a recent decline in their functional status and demonstrates the ability to make significant improvements in function in a reasonable and predictable amount of time.     Precautions / Restrictions Precautions Precautions: Fall Restrictions Weight Bearing Restrictions: No       Mobility  Bed Mobility Overal bed mobility: Modified Independent             General bed mobility comments: minor use of handrail    Transfers Overall transfer level: Needs assistance Equipment used: Rolling walker (2 wheels) Transfers: Sit to/from Stand Sit to Stand: Min guard           General transfer comment: min guard for safety, vc for handplacement for power up    Ambulation/Gait Ambulation/Gait assistance: Min guard Gait Distance (Feet): 50 Feet Assistive device: Rolling walker (2 wheels), None Gait Pattern/deviations: Step-through pattern, Decreased dorsiflexion - right, Decreased weight shift to right Gait velocity: slowed Gait velocity interpretation: <1.31 ft/sec, indicative of household ambulator   General Gait Details: min guard for safety, R hip hike and circumduction to advance R LE due to decreased R dorsiflexion        Balance Overall balance assessment: Needs assistance Sitting-balance support: Feet supported, No upper extremity supported Sitting balance-Leahy Scale: Fair     Standing balance support: During functional activity, Reliant on assistive device for balance, Single extremity supported, Bilateral upper extremity supported Standing balance-Leahy Scale: Poor Standing balance comment: requires UE support                             Pertinent Vitals/Pain Pain Assessment Pain Assessment: No/denies pain    Home Living Family/patient expects to be discharged to:: Private residence Living Arrangements: Children;Spouse/significant other Available Help at Discharge: Available 24 hours/day Type of Home: Apartment Home Access: Level entry       Home Layout: One level Home Equipment: None      Prior  Function Prior Level of Function : Independent/Modified Independent;Working/employed             Mobility Comments: working for [employer] until Jun 7, since then limited mobility sleeping a lot ADLs  Comments: independent     Higher education careers adviser        Extremity/Trunk Assessment   Upper Extremity Assessment Upper Extremity Assessment: RUE deficits/detail RUE Deficits / Details: generalized weakness    Lower Extremity Assessment Lower Extremity Assessment: RLE deficits/detail RLE Deficits / Details: hip 3+/5, knee ext 3/5, knee flex 2+/5, plantar/dorsiflexion 2/5 RLE Sensation: WNL RLE Coordination: WNL       Communication   Communication: No difficulties  Cognition Arousal/Alertness: Awake/alert Behavior During Therapy: WFL for tasks assessed/performed Overall Cognitive Status: Within Functional Limits for tasks assessed                                          General Comments General comments (skin integrity, edema, etc.): SpO2 on RA 98%O2        Assessment/Plan    PT Assessment Patient needs continued PT services  PT Problem List Decreased strength;Decreased range of motion;Decreased activity tolerance;Decreased balance;Decreased mobility;Decreased coordination       PT Treatment Interventions Gait training;DME instruction;Functional mobility training;Therapeutic activities;Therapeutic exercise;Balance training;Cognitive remediation;Patient/family education    PT Goals (Current goals can be found in the Care Plan section)  Acute Rehab PT Goals PT Goal Formulation: With patient/family Time For Goal Achievement: 01/18/23 Potential to Achieve Goals: Fair    Frequency Min 3X/week        AM-PAC PT "6 Clicks" Mobility  Outcome Measure Help needed turning from your back to your side while in a flat bed without using bedrails?: None Help needed moving from lying on your back to sitting on the side of a flat bed without using bedrails?: None Help needed moving to and from a bed to a chair (including a wheelchair)?: A Little Help needed standing up from a chair using your arms (e.g., wheelchair or bedside chair)?: A Little Help needed to walk in  hospital room?: A Little Help needed climbing 3-5 steps with a railing? : A Lot 6 Click Score: 19    End of Session Equipment Utilized During Treatment: Gait belt Activity Tolerance: Patient tolerated treatment well Patient left: in bed;with call bell/phone within reach;with family/visitor present Nurse Communication: Mobility status PT Visit Diagnosis: Muscle weakness (generalized) (M62.81);Hemiplegia and hemiparesis;Unsteadiness on feet (R26.81) Hemiplegia - Right/Left: Right Hemiplegia - dominant/non-dominant: Dominant Hemiplegia - caused by: Cerebral infarction    Time: 0931-1000 PT Time Calculation (min) (ACUTE ONLY): 29 min   Charges:   PT Evaluation $PT Eval Moderate Complexity: 1 Mod PT Treatments $Gait Training: 8-22 mins        Jennilyn Esteve B. Beverely Risen PT, DPT Acute Rehabilitation Services Please use secure chat or  Call Office [phone]   Elon Alas [facility] 01/04/2023, 10:31 AM

## 2023-01-04 NOTE — Consult Note (Addendum)
Cardiology Consultation   Patient ID: CADY HAFEN MRN: [id number]; DOB: Aug 12, 1970  Admit date: 01/03/2023 Date of Consult: 01/04/2023  PCP:  [facility], [facility]   [facility] Providers Cardiologist:  New to [facility] - seen by Dr. Garlan Fillers of [facility] cardiology in Jan 2022   Patient Profile:   Melody Robles is a 52 y.o. 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 Lafonda Mosses.  History of Present Illness:   [name] is a 52 year old female with a past medical history of hypertension, hyperlipidemia, DM 2, chronic systolic heart failure, history of CVA 3/21 and tobacco abuse.  He she was found to have low EF of 30 to 35% with hypokinesis of all segments other than apex during hospitalization for CVA in March 2021.  There was no shunt on bubble study.  ZIO monitor shows no atrial fibrillation.  Repeat echocardiogram obtained on 01/05/2020 showed EF 30 to 35%, possible mid myocardial trabeculation, moderate MR.  She was on metoprolol succinate and lisinopril for heart failure regimen.  Cardiac catheterization performed on 01/31/2020 for persistently low EF revealed angiographically normal coronary arteries.  Last repeat echocardiogram obtained on 08/06/2020 showed EF 40 to 45%, mild to moderate global hypokinesis, focal nodular hypertrophy measuring 9 x 9 mm noted in the basal inferoseptum unchanged from the previous study, moderate to severe MR, frequent PVCs noted.  She has not been seen by cardiology services.  She works as a Teacher, music for [employer].  During the summer, she has not been working, therefore has not done much activity.  She has no problem go to the local supermarket.  She says she pretty much sleep all day during the summer.  She was in her  usual state of health until 2:30AM on Sunday morning when she woke up finding her kids fighting with multiple people outside.  She developed a sudden onset of right-sided weakness.  She did not remember much after that.  She was taken to [facility] emergency room.  MRI of the brain showed left temporal subacute infarct.  Neurology service was consulted.  Echocardiogram obtained on 01/04/2023 showed EF 20 to 25%, global hypokinesis, moderate to severe MR, negative agitated saline contrast bubble study for intra-atrial shunt.  Patient is currently scheduled for TEE on Wednesday.  Cardiology service consulted for LV dysfunction.   Past Medical History:  Diagnosis Date   Hypertension    Stroke [facility])     Past Surgical History:  Procedure Laterality Date   CHOLECYSTECTOMY       Home Medications:  Prior to Admission medications   Medication Sig Start Date End Date Taking? Authorizing Provider  aspirin EC 81 MG tablet Take 1 tablet by mouth daily. 11/25/20 06/12/23 Yes [provider]  lidocaine (LIDODERM) 5 % Place 1 patch onto the skin daily. Remove & Discard patch within 12 hours or as directed by MD 09/11/22  Yes Small, Brooke L, PA  lisinopril (ZESTRIL) 20 MG tablet Take 1 tablet by mouth daily. 08/21/21 06/12/23 Yes [provider]  metoprolol succinate (TOPROL-XL) 25 MG 24 hr tablet Take 1 tablet by mouth daily. 08/21/21 06/12/23 Yes [provider]  atorvastatin (LIPITOR) 10 MG tablet Take by mouth. Patient not taking: Reported on 01/03/2023 06/12/22 06/12/23  [provider]  hydrochlorothiazide (HYDRODIURIL) 12.5 MG tablet Take 1 tablet by mouth every morning. Patient not taking: Reported on 01/03/2023 06/12/22 06/12/23  [provider]  metFORMIN (GLUCOPHAGE) 500 MG tablet Take by mouth 2 (two) times daily with a meal. Patient not taking: Reported on 01/03/2023    [provider]    Inpatient Medications: Scheduled Meds:  aspirin  81 mg Oral  Daily   Or   aspirin  300 mg Rectal Daily   atorvastatin  40 mg Oral Daily   clopidogrel  75 mg Oral Daily   enoxaparin (LOVENOX) injection  40 mg Subcutaneous Q24H   lisinopril  20 mg Oral Daily   Continuous Infusions:  PRN Meds: acetaminophen **OR** acetaminophen, polyethylene glycol  Allergies:    Allergies  Allergen Reactions   Benadryl [Diphenhydramine] Itching and Rash   Morphine Itching and Rash    Social History:   Social History   Socioeconomic History   Marital status: Single    Spouse name: Not on file   Number of children: Not on file   Years of education: Not on file   Highest education level: Not on file  Occupational History   Not on file  Tobacco Use   Smoking status: Every Day    Types: Cigarettes   Smokeless tobacco: Never  Vaping Use   Vaping Use: Never used  Substance and Sexual Activity   Alcohol use: Never   Drug use: Never   Sexual activity: Not on file  Other Topics Concern   Not on file  Social History Narrative   Not on file   Social Determinants of Health   Financial Resource Strain: Not on file  Food Insecurity: Not on file  Transportation Needs: Not on file  Physical Activity: Not on file  Stress: Not on file  Social Connections: Not on file  Intimate Partner Violence: Not on file    Family History:   Family History  Problem Relation Age of Onset   Hypertension Mother    Diabetes Mother      ROS:  Please see the history of present illness.   All other ROS reviewed and negative.     Physical Exam/Data:   Vitals:   01/04/23 1311 01/04/23 1400 01/04/23 1731 01/04/23 1749  BP:  121/81 105/73 127/68  Pulse:  92 89 81  Resp:  18 18 17   Temp: 97.9 F (36.6 C)  97.7 F (36.5 C) 98.1 F (36.7 C)  TempSrc: Oral  Oral Oral  SpO2:  99% 100% 100%  Weight:       No intake or output data in the 24 hours ending 01/04/23 1752    01/03/2023    3:28 AM 09/11/2022    9:47 AM 08/28/2022    4:16 PM  Last 3 Weights  Weight  (lbs) 250 lb 250 lb 250 lb  Weight (kg) 113.4 kg 113.4 kg 113.399 kg     Body mass index is 40.35 kg/m.  General:  Well nourished, well developed, in no acute distress HEENT: normal Neck: no JVD Vascular: No carotid bruits; Distal pulses 2+ bilaterally Cardiac:  normal S1, S2; RRR; no murmur  Lungs:  clear to auscultation bilaterally, no wheezing, rhonchi or rales  Abd: soft, nontender, no hepatomegaly  Ext: no edema Musculoskeletal:  No deformities, BUE and BLE strength normal and equal Skin: warm and dry  Neuro:  CNs 2-12 intact, no focal abnormalities noted Psych:  Normal affect   EKG:  The EKG was personally reviewed and demonstrates:  Normal sinus rhythm, no significant ST-T wave changes Telemetry:  Telemetry was personally reviewed and demonstrates: Normal sinus rhythm, frequent PVCs  Relevant CV Studies:  Echo 01/04/2023  1. Left ventricular ejection fraction, by estimation, is 20 to 25%. The  left ventricle has severely decreased function. The left ventricle  demonstrates global hypokinesis. The left ventricular internal cavity size  was severely dilated. Left ventricular  diastolic parameters are indeterminate.   2. Right ventricular systolic function is normal. The right ventricular  size is normal. Tricuspid regurgitation signal is inadequate for assessing  PA pressure.   3. Left atrial size was mildly dilated.   4. The mitral valve is abnormal. Moderate to severe mitral valve  regurgitation. No evidence of mitral stenosis.   5. The aortic valve is tricuspid. Aortic valve regurgitation is not  visualized. No aortic stenosis is present.   6. The inferior vena cava is normal in size with greater than 50%  respiratory variability, suggesting right atrial pressure of 3 mmHg.   7. Agitated saline contrast bubble study was negative, with no evidence  of any interatrial shunt.   Comparison(s): Prior images unable to be directly viewed, comparison made  by report only.  Changes from prior study are noted. Prior echo [facility]/06/20 at  [facility] with reported EF 40-45%, worse on current study.   Laboratory Data:  High Sensitivity Troponin:  No results for input(s): "TROPONINIHS" in the last 720 hours.   Chemistry Recent Labs  Lab 01/03/23 0331 01/03/23 0332 01/04/23 0048  NA 137 140 137  K 3.9 3.9 3.8  CL 105 110 107  CO2 18*  --  21*  GLUCOSE 158* 152* 110*  BUN 18 20 16   CREATININE 1.35* 1.20* 1.14*  CALCIUM 8.9  --  8.7*  GFRNONAA 48*  --  59*  ANIONGAP 14  --  9    Recent Labs  Lab 01/03/23 0331  PROT 7.1  ALBUMIN 3.4*  AST 23  ALT 30  ALKPHOS 82  BILITOT 0.5   Lipids  Recent Labs  Lab 01/03/23 1038  CHOL 241*  TRIG 153*  HDL 39*  LDLCALC 171*  CHOLHDL 6.2    Hematology Recent Labs  Lab 01/03/23 0331 01/03/23 0332 01/04/23 0048  WBC 6.6  --  5.7  RBC 4.92  --  4.71  HGB 12.5 14.6 12.0  HCT 39.8 43.0 38.7  MCV 80.9  --  82.2  MCH 25.4*  --  25.5*  MCHC 31.4  --  31.0  RDW 14.8  --  14.4  PLT 307  --  298   Thyroid No results for input(s): "TSH", "FREET4" in the last 168 hours.  BNPNo results for input(s): "BNP", "PROBNP" in the last 168 hours.  DDimer No results for input(s): "DDIMER" in the last 168 hours.   Radiology/Studies:  ECHOCARDIOGRAM COMPLETE BUBBLE STUDY  Result Date: 01/04/2023    ECHOCARDIOGRAM REPORT   Patient Name:   Melody Robles Date of Exam: 01/04/2023 Medical Rec #:  [id number]          Height:       66.0 in Accession #:    [id number]         Weight:       250.0 lb Date of Birth:  11-Dec-1970          BSA:          2.199 m Patient Age:    51 years           BP:  142/89 mmHg Patient Gender: F                  HR:           93 bpm. Exam Location:  Inpatient Procedure: 2D Echo, Cardiac Doppler, Color Doppler, Saline Contrast Bubble Study            and Intracardiac Opacification Agent Indications:    stroke  History:        Patient has prior history of Echocardiogram examinations, most                  recent 08/06/2020. Stroke; Risk Factors:Current Smoker,                 Hypertension, Diabetes and Dyslipidemia.  Sonographer:    Delcie Roch RDCS Referring Phys: [id number] [facility] IMPRESSIONS  1. Left ventricular ejection fraction, by estimation, is 20 to 25%. The left ventricle has severely decreased function. The left ventricle demonstrates global hypokinesis. The left ventricular internal cavity size was severely dilated. Left ventricular diastolic parameters are indeterminate.  2. Right ventricular systolic function is normal. The right ventricular size is normal. Tricuspid regurgitation signal is inadequate for assessing PA pressure.  3. Left atrial size was mildly dilated.  4. The mitral valve is abnormal. Moderate to severe mitral valve regurgitation. No evidence of mitral stenosis.  5. The aortic valve is tricuspid. Aortic valve regurgitation is not visualized. No aortic stenosis is present.  6. The inferior vena cava is normal in size with greater than 50% respiratory variability, suggesting right atrial pressure of 3 mmHg.  7. Agitated saline contrast bubble study was negative, with no evidence of any interatrial shunt. Comparison(s): Prior images unable to be directly viewed, comparison made by report only. Changes from prior study are noted. Prior echo [facility]/06/20 at [facility] with reported EF 40-45%, worse on current study. Conclusion(s)/Recommendation(s): Severely dilated LV, with severely reduced LVEF. Very eccentric MR, likely moderate-severe. Findings communicated to primary team. FINDINGS  Left Ventricle: Left ventricular ejection fraction, by estimation, is 20 to 25%. The left ventricle has severely decreased function. The left ventricle demonstrates global hypokinesis. Definity contrast agent was given IV to delineate the left ventricular endocardial borders. The left ventricular internal cavity size was severely dilated. There is no left ventricular hypertrophy. Left ventricular diastolic  parameters are indeterminate. Right Ventricle: The right ventricular size is normal. No increase in right ventricular wall thickness. Right ventricular systolic function is normal. Tricuspid regurgitation signal is inadequate for assessing PA pressure. Left Atrium: Left atrial size was mildly dilated. Right Atrium: Right atrial size was normal in size. Pericardium: There is no evidence of pericardial effusion. Mitral Valve: Highly eccentric MR jet, moderate-severe. The mitral valve is abnormal. There is mild thickening of the mitral valve leaflet(s). There is mild calcification of the mitral valve leaflet(s). Moderate to severe mitral valve regurgitation. No evidence of mitral valve stenosis. Tricuspid Valve: The tricuspid valve is grossly normal. Tricuspid valve regurgitation is trivial. No evidence of tricuspid stenosis. Aortic Valve: The aortic valve is tricuspid. Aortic valve regurgitation is not visualized. No aortic stenosis is present. Pulmonic Valve: The pulmonic valve was not well visualized. Pulmonic valve regurgitation is trivial. No evidence of pulmonic stenosis. Aorta: The aortic root, ascending aorta and aortic arch are all structurally normal, with no evidence of dilitation or obstruction. Venous: The inferior vena cava is normal in size with greater than 50% respiratory variability, suggesting right atrial pressure of 3 mmHg. IAS/Shunts: The atrial septum  is grossly normal. Agitated saline contrast was given intravenously to evaluate for intracardiac shunting. Agitated saline contrast bubble study was negative, with no evidence of any interatrial shunt.  LEFT VENTRICLE PLAX 2D LVIDd:         7.10 cm      Diastology LVIDs:         6.36 cm      LV e' medial:    7.18 cm/s LV PW:         1.00 cm      LV E/e' medial:  14.3 LV IVS:        0.90 cm      LV e' lateral:   6.09 cm/s LVOT diam:     2.20 cm      LV E/e' lateral: 16.9 LV SV:         44 LV SV Index:   20 LVOT Area:     3.80 cm  LV Volumes (MOD) LV  vol d, MOD A2C: 246.0 ml LV vol s, MOD A2C: 174.0 ml LV SV MOD A2C:     72.0 ml RIGHT VENTRICLE             IVC RV Basal diam:  2.10 cm     IVC diam: 2.00 cm RV S prime:     10.20 cm/s TAPSE (M-mode): 1.9 cm LEFT ATRIUM             Index        RIGHT ATRIUM          Index LA diam:        3.80 cm 1.73 cm/m   RA Area:     9.43 cm LA Vol (A2C):   65.7 ml 29.88 ml/m  RA Volume:   17.70 ml 8.05 ml/m LA Vol (A4C):   64.1 ml 29.15 ml/m LA Biplane Vol: 68.4 ml 31.11 ml/m  AORTIC VALVE LVOT Vmax:   75.40 cm/s LVOT Vmean:  44.400 cm/s LVOT VTI:    0.116 m  AORTA Ao Root diam: 2.70 cm Ao Asc diam:  3.00 cm MR Peak grad: 124.5 mmHg MR Mean grad: 77.0 mmHg     SHUNTS MR Vmax:      558.00 cm/s   Systemic VTI:  0.12 m MR Vmean:     410.0 cm/s    Systemic Diam: 2.20 cm MV E velocity: 103.00 cm/s MV A velocity: 136.00 cm/s MV E/A ratio:  0.76 Jodelle Red MD Electronically signed by Jodelle Red MD Signature Date/Time: 01/04/2023/3:40:08 PM    Final    CT ANGIO HEAD NECK W WO CM  Result Date: 01/03/2023 CLINICAL DATA:  Stroke suspected, determine embolic source EXAM: CT ANGIOGRAPHY HEAD AND NECK WITH AND WITHOUT CONTRAST TECHNIQUE: Multidetector CT imaging of the head and neck was performed using the standard protocol during bolus administration of intravenous contrast. Multiplanar CT image reconstructions and MIPs were obtained to evaluate the vascular anatomy. Carotid stenosis measurements (when applicable) are obtained utilizing NASCET criteria, using the distal internal carotid diameter as the denominator. RADIATION DOSE REDUCTION: This exam was performed according to the departmental dose-optimization program which includes automated exposure control, adjustment of the mA and/or kV according to patient size and/or use of iterative reconstruction technique. CONTRAST:  75mL OMNIPAQUE IOHEXOL 350 MG/ML SOLN COMPARISON:  Brain MRI from earlier today FINDINGS: CTA NECK FINDINGS Aortic arch: Normal with 3  vessel branching. Right carotid system: Vessels are smoothly contoured and widely patent without atherosclerosis. Left carotid system: Vessels are smoothly contoured  and widely patent without atherosclerosis. Vertebral arteries: Vessels are smoothly contoured and widely patent without atherosclerosis. Strong right vertebral artery dominance. Skeleton: Ordinary cervical spine degeneration. Other neck: Unremarkable Upper chest: Clear apical lungs Review of the MIP images confirms the above findings CTA HEAD FINDINGS Anterior circulation: No significant stenosis, proximal occlusion, aneurysm, or vascular malformation. Posterior circulation: Strong right vertebral artery dominance. Vessels are smoothly contoured and diffusely patent. No branch occlusion or aneurysm Venous sinuses: Normal Anatomic variants: None significant Review of the MIP images confirms the above findings IMPRESSION: Normal CTA of the head and neck. Electronically Signed   By: Tiburcio Pea M.D.   On: 01/03/2023 09:54   MR BRAIN W CONTRAST  Result Date: 01/03/2023 CLINICAL DATA:  Headache.  Abnormal left temporal lobe. EXAM: MRI HEAD WITH CONTRAST TECHNIQUE: Multiplanar, multiecho pulse sequences of the brain and surrounding structures were obtained with intravenous contrast. CONTRAST:  10mL GADAVIST GADOBUTROL 1 MMOL/ML IV SOLN COMPARISON:  Noncontrast brain MRI from earlier today FINDINGS: The anterior and lateral left temporal cortex abnormality is T1 hyperintense by prior brain MRI and shows cortically based enhancement. On T2 weighted imaging there is suggestion of early volume loss and subacute infarct is favored. There is somewhat unusual extension into the subinsular white matter and follow-up is recommended in this young patient to exclude mass or inflammatory process. No other areas of abnormal enhancement. IMPRESSION: T1 hyperintense and enhancing cortex at the left temporal lobe abnormality, favor subacute infarct. Follow-up with  contrast in approximately 2 months is recommended to confirm resolution. Electronically Signed   By: Tiburcio Pea M.D.   On: 01/03/2023 06:58   MR BRAIN WO CONTRAST  Result Date: 01/03/2023 CLINICAL DATA:  Neuro deficit with acute stroke suspected EXAM: MRI HEAD WITHOUT CONTRAST TECHNIQUE: Multiplanar, multiecho pulse sequences of the brain and surrounding structures were obtained without intravenous contrast. COMPARISON:  Head CT from earlier today in brain MRI 10/03/2019 FINDINGS: Brain: No acute infarction, hemorrhage, hydrocephalus, extra-axial collection or mass lesion. A small diffusion hyperintense focus in the left corona radiata demonstrates shine through on ADC map. There is a small area of new FLAIR hyperintensity with linear T1 hyperintensity along the superficial left temporal lobe with volume loss best seen on axial T2 weighted imaging, consistent with encephalomalacia. This could be posttraumatic or post ischemic in this location, minimal chronic blood products suggested on gradient imaging. Few tiny FLAIR hyperintensities in the cerebral white matter with nonspecific pattern. Brain volume is normal. Vascular: Normal flow voids. Skull and upper cervical spine: Normal marrow signal. Sinuses/Orbits: Minimal left mastoid opacification. Negative orbits. IMPRESSION: 1. No acute finding. 2. Small area of cortical encephalomalacia in the left temporal lobe since 2021 3. Mild white matter disease with nonspecific pattern, history of hypertension favoring chronic microvascular ischemia. Electronically Signed   By: Tiburcio Pea M.D.   On: 01/03/2023 04:32   CT HEAD CODE STROKE WO CONTRAST  Result Date: 01/03/2023 CLINICAL DATA:  Code stroke. Acute neurologic deficit. Slurred speech and right-sided weakness. EXAM: CT HEAD WITHOUT CONTRAST TECHNIQUE: Contiguous axial images were obtained from the base of the skull through the vertex without intravenous contrast. RADIATION DOSE REDUCTION: This exam  was performed according to the departmental dose-optimization program which includes automated exposure control, adjustment of the mA and/or kV according to patient size and/or use of iterative reconstruction technique. COMPARISON:  None Available. FINDINGS: Brain: There is no mass, hemorrhage or extra-axial collection. The size and configuration of the ventricles and extra-axial CSF spaces are  normal. The brain parenchyma is normal, without evidence of acute or chronic infarction. Vascular: No abnormal hyperdensity of the major intracranial arteries or dural venous sinuses. No intracranial atherosclerosis. Skull: The visualized skull base, calvarium and extracranial soft tissues are normal. Sinuses/Orbits: No fluid levels or advanced mucosal thickening of the visualized paranasal sinuses. No mastoid or middle ear effusion. The orbits are normal. ASPECTS [facility] Stroke Program Early CT Score) - Ganglionic level infarction (caudate, lentiform nuclei, internal capsule, insula, M1-M3 cortex): 7 - Supraganglionic infarction (M4-M6 cortex): 3 Total score (0-10 with 10 being normal): 10 IMPRESSION: 1. No acute intracranial abnormality. 2. ASPECTS is 10. These results were communicated to Dr. Erick Blinks at 3:39 am on 01/03/2023 by text page via the [facility] messaging system. Electronically Signed   By: Deatra Robinson M.D.   On: 01/03/2023 03:40     Assessment and Plan:   Chronic systolic heart failure  -EF 30 to 35% in 2021, improved to 40 to 45% by January 2022.  Cardiac catheterization in July 2021 showed clean coronary arteries.  Low EF initially occurred in the setting of acute CVA in 2021.  Echocardiogram in 2021 suggested possible trabeculation in the LV and echocardiogram in 2022 showed a 9 mm x 9 mm nodular hypertrophy in the basal inferoseptum  -Discussed with Dr. Izora Ribas, will plan for cardiac MRI tomorrow. TEE on Wesnesday. Titrate GDMT.  Consider switch lisinopril to [facility] after 36-hour  washout.    Subacute left temporal infarct: Prior history of CVA in 2021.  Patient says she had 5 CVA in a matter of one hour (?) in March 2021.  She has not had any recurrent CVA until abnormal finding during this hospitalization.  -Risk and benefit of TEE has been explained to the patient, he is agreeable to proceed  Frequent PVCs: Consider either restart home metoprolol succinate or start on carvedilol  Moderate to severe MR: Cardiac MRI     Risk Assessment/Risk Scores:             [facility] has been requested to perform a transesophageal echocardiogram on Melody Robles for stroke.  After careful review of history and examination, the risks and benefits of transesophageal echocardiogram have been explained including risks of esophageal damage, perforation (1:10,000 risk), bleeding, pharyngeal hematoma as well as other potential complications associated with anesthesia including aspiration, arrhythmia, respiratory failure and death. Alternatives to treatment were discussed, questions were answered. Patient is willing to proceed.   Azalee Course, [place] 01/04/2023 5:52 PM        For questions or updates, please contact [facility] Please consult [email or web address] for contact info under    Signed, Azalee Course, [place]  01/04/2023 5:52 PM   Personally seen and examined. Agree with APP above with the following comments:  Briefly 52 yo F with a history of HTN, HLD with DM, chronic systolic heart failure, history of CVA who presents worsening LVEF.  Patient notes the story of her sons illness associated with him needed prolonged hospitalization.  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  She has established care with [facility] but notes that she hasn't found the perfect fit for therapeutic relationship.  Widely patent cath.  She has had irregular heart beats all of her life.  Her weakness has now resolved. No CP, no SOB, no palpitations, no  syncope.  Exam notable for  -systolic  heart murmur and morbid obesity; otherwise as above  EKG SR with rare PVCs and clear evidence of  lead reversal  Tele: Unable to view (patient was in transition from ED) Personally reviewed relevant tests; LVEF is dilated and severely decreased; color splay artifact of MR suggests significance.I do not see the trabeculation noted at [facility]  Would recommend  - TEE is reasonable, earliest available is Wednesday - would performed CMR tomorrow to rule our LVNC and Scar and to quantitate MR; she has ativan as a prn for thisfor this; Abdou Stocks to read - planning lisinopril wash out for ENTRESTO start - continue metoprolol, will work to start SGLT2i this admission  Riley Lam, MD FASE [facility] Cardiologist [facility] [facility]  [address] [place], [address] [place], [place] [address] [phone]  6:25 PM

## 2023-01-04 NOTE — ED Notes (Signed)
ED TO INPATIENT HANDOFF REPORT  ED Nurse Name and Phone #: Theophilus Bones [phone]  S Name/Age/Gender Melody Robles 52 y.o. female Room/Bed: 038C/038C  Code Status   Code Status: Full Code  Home/SNF/Other Home Patient oriented to: self, place, time, and situation Is this baseline? Yes   Triage Complete: Triage complete  Chief Complaint Acute CVA (cerebrovascular accident) [facility]) [I63.9]  Triage Note Pt in as code stroke via [facility] with reported slurred speech and R sided weakness. LSN 0245   Allergies Allergies  Allergen Reactions   Benadryl [Diphenhydramine] Itching and Rash   Morphine Itching and Rash    Level of Care/Admitting Diagnosis ED Disposition     ED Disposition  Admit   Condition  --   Comment  Hospital Area: [facility] [[id number]]  Level of Care: Telemetry Medical [104]  May place patient in observation at [facility] or [facility] if equivalent level of care is available:: No  Covid Evaluation: Asymptomatic - no recent exposure (last 10 days) testing not required  Diagnosis: Acute CVA (cerebrovascular accident) [facility]) [[id number]]  Admitting Physician: Mercie Eon [[id number]]  Attending Physician: Mercie Eon [[id number]]          B Medical/Surgery History Past Medical History:  Diagnosis Date   Hypertension    Stroke [facility])    Past Surgical History:  Procedure Laterality Date   CHOLECYSTECTOMY       A IV Location/Drains/Wounds Patient Lines/Drains/Airways Status     Active Line/Drains/Airways     Name Placement date Placement time Site Days   Peripheral IV 09/11/22 20 G Left Antecubital 09/11/22  1003  Antecubital  115            Intake/Output Last 24 hours No intake or output data in the 24 hours ending 01/04/23 8657  Labs/Imaging Results for orders placed or performed during the hospital encounter of 01/03/23 (from the past 48 hour(s))  HIV Antibody (routine testing w rflx)     Status: None   Collection  Time: 01/03/23 12:48 AM  Result Value Ref Range   HIV Screen 4th Generation wRfx Non Reactive Non Reactive    Comment: Performed at [facility], [address], [place], [place] [address]  CBG monitoring, ED     Status: Abnormal   Collection Time: 01/03/23  3:25 AM  Result Value Ref Range   Glucose-Capillary 148 (H) 70 - 99 mg/dL    Comment: Glucose reference range applies only to samples taken after fasting for at least 8 hours.  Ethanol     Status: None   Collection Time: 01/03/23  3:31 AM  Result Value Ref Range   Alcohol, Ethyl (B) <10 <10 mg/dL    Comment: (NOTE) Lowest detectable limit for serum alcohol is 10 mg/dL.  For medical purposes only. Performed at [facility], [address] [place], [place] [address]   Protime-INR     Status: None   Collection Time: 01/03/23  3:31 AM  Result Value Ref Range   Prothrombin Time 13.3 11.4 - 15.2 seconds   INR 1.0 0.8 - 1.2    Comment: (NOTE) INR goal varies based on device and disease states. Performed at [facility], [address] [place], [place] [address]   APTT     Status: Abnormal   Collection Time: 01/03/23  3:31 AM  Result Value Ref Range   aPTT 22 (L) 24 - 36 seconds    Comment: Performed at [facility],  [address], [place], [place] [address]  CBC     Status: Abnormal   Collection Time: 01/03/23  3:31 AM  Result Value Ref Range   WBC 6.6 4.0 - 10.5 K/uL   RBC 4.92 3.87 - 5.11 MIL/uL   Hemoglobin 12.5 12.0 - 15.0 g/dL   HCT 60.4 54.0 - 98.1 %   MCV 80.9 80.0 - 100.0 fL   MCH 25.4 (L) 26.0 - 34.0 pg   MCHC 31.4 30.0 - 36.0 g/dL   RDW 19.1 47.8 - 29.5 %   Platelets 307 150 - 400 K/uL   nRBC 0.0 0.0 - 0.2 %    Comment: Performed at [facility], [address], [place], [place] [address]  Differential     Status: None   Collection Time: 01/03/23  3:31 AM  Result Value Ref Range   Neutrophils Relative % 32 %   Neutro Abs 2.1 1.7 - 7.7 K/uL   Lymphocytes Relative 56 %    Lymphs Abs 3.8 0.7 - 4.0 K/uL   Monocytes Relative 7 %   Monocytes Absolute 0.5 0.1 - 1.0 K/uL   Eosinophils Relative 3 %   Eosinophils Absolute 0.2 0.0 - 0.5 K/uL   Basophils Relative 1 %   Basophils Absolute 0.1 0.0 - 0.1 K/uL   Immature Granulocytes 1 %   Abs Immature Granulocytes 0.03 0.00 - 0.07 K/uL    Comment: Performed at [facility], [address], [place], [place] [address]  Comprehensive metabolic panel     Status: Abnormal   Collection Time: 01/03/23  3:31 AM  Result Value Ref Range   Sodium 137 135 - 145 mmol/L   Potassium 3.9 3.5 - 5.1 mmol/L   Chloride 105 98 - 111 mmol/L   CO2 18 (L) 22 - 32 mmol/L   Glucose, Bld 158 (H) 70 - 99 mg/dL    Comment: Glucose reference range applies only to samples taken after fasting for at least 8 hours.   BUN 18 6 - 20 mg/dL   Creatinine, Ser 4.69 (H) 0.44 - 1.00 mg/dL   Calcium 8.9 8.9 - 62.9 mg/dL   Total Protein 7.1 6.5 - 8.1 g/dL   Albumin 3.4 (L) 3.5 - 5.0 g/dL   AST 23 15 - 41 U/L   ALT 30 0 - 44 U/L   Alkaline Phosphatase 82 38 - 126 U/L   Total Bilirubin 0.5 0.3 - 1.2 mg/dL   GFR, Estimated 48 (L) >60 mL/min    Comment: (NOTE) Calculated using the CKD-EPI Creatinine Equation (2021)    Anion gap 14 5 - 15    Comment: Performed at [facility], [address] [place], [place] [address]  I-stat chem 8, ED     Status: Abnormal   Collection Time: 01/03/23  3:32 AM  Result Value Ref Range   Sodium 140 135 - 145 mmol/L   Potassium 3.9 3.5 - 5.1 mmol/L   Chloride 110 98 - 111 mmol/L   BUN 20 6 - 20 mg/dL   Creatinine, Ser 3.24 (H) 0.44 - 1.00 mg/dL   Glucose, Bld 401 (H) 70 - 99 mg/dL    Comment: Glucose reference range applies only to samples taken after fasting for at least 8 hours.   Calcium, Ion 1.12 (L) 1.15 - 1.40 mmol/L   TCO2 19 (L) 22 - 32 mmol/L   Hemoglobin 14.6 12.0 - 15.0 g/dL   HCT 02.7 25.3 - 66.4 %  Hemoglobin A1c  Status: Abnormal   Collection Time: 01/03/23 10:38 AM  Result Value Ref  Range   Hgb A1c MFr Bld 8.0 (H) 4.8 - 5.6 %    Comment: (NOTE) Pre diabetes:          5.7%-6.4%  Diabetes:              >6.4%  Glycemic control for   <7.0% adults with diabetes    Mean Plasma Glucose 182.9 mg/dL    Comment: Performed at [facility], [address], [place], [place] [address]  Lipid panel     Status: Abnormal   Collection Time: 01/03/23 10:38 AM  Result Value Ref Range   Cholesterol 241 (H) 0 - 200 mg/dL   Triglycerides 962 (H) <150 mg/dL   HDL 39 (L) >95 mg/dL   Total CHOL/HDL Ratio 6.2 RATIO   VLDL 31 0 - 40 mg/dL   LDL Cholesterol 284 (H) 0 - 99 mg/dL    Comment:        Total Cholesterol/HDL:CHD Risk Coronary Heart Disease Risk Table                     Men   Women  1/2 Average Risk   3.4   3.3  Average Risk       5.0   4.4  2 X Average Risk   9.6   7.1  3 X Average Risk  23.4   11.0        Use the calculated Patient Ratio above and the CHD Risk Table to determine the patient's CHD Risk.        ATP III CLASSIFICATION (LDL):  <100     mg/dL   Optimal  132-440  mg/dL   Near or Above                    Optimal  130-159  mg/dL   Borderline  102-725  mg/dL   High  >366     mg/dL   Very High Performed at [facility], [address] [place], [place] [address]   Basic metabolic panel     Status: Abnormal   Collection Time: 01/04/23 12:48 AM  Result Value Ref Range   Sodium 137 135 - 145 mmol/L   Potassium 3.8 3.5 - 5.1 mmol/L   Chloride 107 98 - 111 mmol/L   CO2 21 (L) 22 - 32 mmol/L   Glucose, Bld 110 (H) 70 - 99 mg/dL    Comment: Glucose reference range applies only to samples taken after fasting for at least 8 hours.   BUN 16 6 - 20 mg/dL   Creatinine, Ser 7.42 (H) 0.44 - 1.00 mg/dL   Calcium 8.7 (L) 8.9 - 10.3 mg/dL   GFR, Estimated 59 (L) >60 mL/min    Comment: (NOTE) Calculated using the CKD-EPI Creatinine Equation (2021)    Anion gap 9 5 - 15    Comment: Performed at [facility], [address] [place], [place]  [address]  CBC     Status: Abnormal   Collection Time: 01/04/23 12:48 AM  Result Value Ref Range   WBC 5.7 4.0 - 10.5 K/uL   RBC 4.71 3.87 - 5.11 MIL/uL   Hemoglobin 12.0 12.0 - 15.0 g/dL   HCT 87.5 64.3 - 32.9 %   MCV 82.2 80.0 - 100.0 fL   MCH 25.5 (L) 26.0 - 34.0 pg   MCHC 31.0 30.0 - 36.0 g/dL  RDW 14.4 11.5 - 15.5 %   Platelets 298 150 - 400 K/uL   nRBC 0.0 0.0 - 0.2 %    Comment: Performed at [facility], [address] [place], [place] [address]   CT ANGIO HEAD NECK W WO CM  Result Date: 01/03/2023 CLINICAL DATA:  Stroke suspected, determine embolic source EXAM: CT ANGIOGRAPHY HEAD AND NECK WITH AND WITHOUT CONTRAST TECHNIQUE: Multidetector CT imaging of the head and neck was performed using the standard protocol during bolus administration of intravenous contrast. Multiplanar CT image reconstructions and MIPs were obtained to evaluate the vascular anatomy. Carotid stenosis measurements (when applicable) are obtained utilizing NASCET criteria, using the distal internal carotid diameter as the denominator. RADIATION DOSE REDUCTION: This exam was performed according to the departmental dose-optimization program which includes automated exposure control, adjustment of the mA and/or kV according to patient size and/or use of iterative reconstruction technique. CONTRAST:  75mL OMNIPAQUE IOHEXOL 350 MG/ML SOLN COMPARISON:  Brain MRI from earlier today FINDINGS: CTA NECK FINDINGS Aortic arch: Normal with 3 vessel branching. Right carotid system: Vessels are smoothly contoured and widely patent without atherosclerosis. Left carotid system: Vessels are smoothly contoured and widely patent without atherosclerosis. Vertebral arteries: Vessels are smoothly contoured and widely patent without atherosclerosis. Strong right vertebral artery dominance. Skeleton: Ordinary cervical spine degeneration. Other neck: Unremarkable Upper chest: Clear apical lungs Review of the MIP images confirms the above  findings CTA HEAD FINDINGS Anterior circulation: No significant stenosis, proximal occlusion, aneurysm, or vascular malformation. Posterior circulation: Strong right vertebral artery dominance. Vessels are smoothly contoured and diffusely patent. No branch occlusion or aneurysm Venous sinuses: Normal Anatomic variants: None significant Review of the MIP images confirms the above findings IMPRESSION: Normal CTA of the head and neck. Electronically Signed   By: Tiburcio Pea M.D.   On: 01/03/2023 09:54   MR BRAIN W CONTRAST  Result Date: 01/03/2023 CLINICAL DATA:  Headache.  Abnormal left temporal lobe. EXAM: MRI HEAD WITH CONTRAST TECHNIQUE: Multiplanar, multiecho pulse sequences of the brain and surrounding structures were obtained with intravenous contrast. CONTRAST:  10mL GADAVIST GADOBUTROL 1 MMOL/ML IV SOLN COMPARISON:  Noncontrast brain MRI from earlier today FINDINGS: The anterior and lateral left temporal cortex abnormality is T1 hyperintense by prior brain MRI and shows cortically based enhancement. On T2 weighted imaging there is suggestion of early volume loss and subacute infarct is favored. There is somewhat unusual extension into the subinsular white matter and follow-up is recommended in this young patient to exclude mass or inflammatory process. No other areas of abnormal enhancement. IMPRESSION: T1 hyperintense and enhancing cortex at the left temporal lobe abnormality, favor subacute infarct. Follow-up with contrast in approximately 2 months is recommended to confirm resolution. Electronically Signed   By: Tiburcio Pea M.D.   On: 01/03/2023 06:58   MR BRAIN WO CONTRAST  Result Date: 01/03/2023 CLINICAL DATA:  Neuro deficit with acute stroke suspected EXAM: MRI HEAD WITHOUT CONTRAST TECHNIQUE: Multiplanar, multiecho pulse sequences of the brain and surrounding structures were obtained without intravenous contrast. COMPARISON:  Head CT from earlier today in brain MRI 10/03/2019 FINDINGS:  Brain: No acute infarction, hemorrhage, hydrocephalus, extra-axial collection or mass lesion. A small diffusion hyperintense focus in the left corona radiata demonstrates shine through on ADC map. There is a small area of new FLAIR hyperintensity with linear T1 hyperintensity along the superficial left temporal lobe with volume loss best seen on axial T2 weighted imaging, consistent with encephalomalacia. This could be posttraumatic or post  ischemic in this location, minimal chronic blood products suggested on gradient imaging. Few tiny FLAIR hyperintensities in the cerebral white matter with nonspecific pattern. Brain volume is normal. Vascular: Normal flow voids. Skull and upper cervical spine: Normal marrow signal. Sinuses/Orbits: Minimal left mastoid opacification. Negative orbits. IMPRESSION: 1. No acute finding. 2. Small area of cortical encephalomalacia in the left temporal lobe since 2021 3. Mild white matter disease with nonspecific pattern, history of hypertension favoring chronic microvascular ischemia. Electronically Signed   By: Tiburcio Pea M.D.   On: 01/03/2023 04:32   CT HEAD CODE STROKE WO CONTRAST  Result Date: 01/03/2023 CLINICAL DATA:  Code stroke. Acute neurologic deficit. Slurred speech and right-sided weakness. EXAM: CT HEAD WITHOUT CONTRAST TECHNIQUE: Contiguous axial images were obtained from the base of the skull through the vertex without intravenous contrast. RADIATION DOSE REDUCTION: This exam was performed according to the departmental dose-optimization program which includes automated exposure control, adjustment of the mA and/or kV according to patient size and/or use of iterative reconstruction technique. COMPARISON:  None Available. FINDINGS: Brain: There is no mass, hemorrhage or extra-axial collection. The size and configuration of the ventricles and extra-axial CSF spaces are normal. The brain parenchyma is normal, without evidence of acute or chronic infarction. Vascular:  No abnormal hyperdensity of the major intracranial arteries or dural venous sinuses. No intracranial atherosclerosis. Skull: The visualized skull base, calvarium and extracranial soft tissues are normal. Sinuses/Orbits: No fluid levels or advanced mucosal thickening of the visualized paranasal sinuses. No mastoid or middle ear effusion. The orbits are normal. ASPECTS [facility] Stroke Program Early CT Score) - Ganglionic level infarction (caudate, lentiform nuclei, internal capsule, insula, M1-M3 cortex): 7 - Supraganglionic infarction (M4-M6 cortex): 3 Total score (0-10 with 10 being normal): 10 IMPRESSION: 1. No acute intracranial abnormality. 2. ASPECTS is 10. These results were communicated to Dr. Erick Blinks at 3:39 am on 01/03/2023 by text page via the [facility] messaging system. Electronically Signed   By: Deatra Robinson M.D.   On: 01/03/2023 03:40    Pending Labs Unresulted Labs (From admission, onward)     Start     Ordered   01/03/23 1621  Rapid urine drug screen (hospital performed)  ONCE - STAT,   STAT        01/03/23 1620   01/03/23 0758  Lupus anticoagulant panel  (Hypercoagulable Panel, Comprehensive (PNL))  Once,   URGENT        01/03/23 0757   01/03/23 0758  Beta-2-glycoprotein i abs, IgG/M/A  (Hypercoagulable Panel, Comprehensive (PNL))  Once,   URGENT        01/03/23 0757   01/03/23 0758  Homocysteine, serum  (Hypercoagulable Panel, Comprehensive (PNL))  Once,   URGENT        01/03/23 0757   01/03/23 0758  Cardiolipin antibodies, IgG, IgM, IgA  (Hypercoagulable Panel, Comprehensive (PNL))  Once,   URGENT        01/03/23 0757            Vitals/Pain Today's Vitals   01/04/23 0600 01/04/23 0615 01/04/23 0630 01/04/23 0700  BP: 109/70  112/74 112/73  Pulse: 80  80 85  Resp: 12  12 14   Temp:      TempSrc:      SpO2: 98%  98% 97%  Weight:      PainSc:  Asleep      Isolation Precautions No active isolations  Medications Medications  aspirin chewable tablet 81 mg (81  mg Oral Given 01/03/23 1129)  Or  aspirin suppository 300 mg ( Rectal See Alternative 01/03/23 1129)  clopidogrel (PLAVIX) tablet 75 mg (75 mg Oral Given 01/03/23 1129)  enoxaparin (LOVENOX) injection 40 mg (40 mg Subcutaneous Given 01/03/23 1542)  acetaminophen (TYLENOL) tablet 650 mg (has no administration in time range)    Or  acetaminophen (TYLENOL) suppository 650 mg (has no administration in time range)  polyethylene glycol (MIRALAX / GLYCOLAX) packet 17 g (has no administration in time range)  metoprolol succinate (TOPROL-XL) 24 hr tablet 25 mg (25 mg Oral Given 01/03/23 1129)  atorvastatin (LIPITOR) tablet 40 mg (40 mg Oral Given 01/03/23 1129)  LORazepam (ATIVAN) injection 1 mg (1 mg Intravenous Given 01/03/23 0517)  gadobutrol (GADAVIST) 1 MMOL/ML injection 10 mL (10 mLs Intravenous Contrast Given 01/03/23 0644)  iohexol (OMNIPAQUE) 350 MG/ML injection 75 mL (75 mLs Intravenous Contrast Given 01/03/23 0945)    Mobility walks with device     Focused Assessments Neuro Assessment Handoff:  Swallow screen pass? Yes    NIH Stroke Scale  Dizziness Present: No Headache Present: No Interval: Shift assessment Level of Consciousness (1a.)   : Alert, keenly responsive LOC Questions (1b. )   : Answers both questions correctly LOC Commands (1c. )   : Performs both tasks correctly Best Gaze (2. )  : Normal Visual (3. )  : No visual loss Facial Palsy (4. )    : Normal symmetrical movements Motor Arm, Left (5a. )   : No drift Motor Arm, Right (5b. ) : Drift Motor Leg, Left (6a. )  : No drift Motor Leg, Right (6b. ) : Drift Limb Ataxia (7. ): Present in one limb Sensory (8. )  : Mild-to-moderate sensory loss, patient feels pinprick is less sharp or is dull on the affected side, or there is a loss of superficial pain with pinprick, but patient is aware of being touched Best Language (9. )  : No aphasia Dysarthria (10. ): Normal Extinction/Inattention (11.)   : No Abnormality Complete  NIHSS TOTAL: 4 Last date known well: 01/03/23 Last time known well: 0245 Neuro Assessment:   Neuro Checks:   Initial (01/03/23 0330)  Has TPA been given? No If patient is a Neuro Trauma and patient is going to OR before floor call report to 4N Charge nurse: [phone] or [phone]   R Recommendations: See Admitting Provider Note  Report given to:   Additional Notes: pt uses a walker at home to assist with ambulation.

## 2023-01-05 ENCOUNTER — Observation Stay (HOSPITAL_COMMUNITY): Payer: 59

## 2023-01-05 ENCOUNTER — Other Ambulatory Visit (HOSPITAL_COMMUNITY): Payer: Self-pay

## 2023-01-05 ENCOUNTER — Encounter (HOSPITAL_COMMUNITY): Payer: Self-pay | Admitting: Internal Medicine

## 2023-01-05 DIAGNOSIS — I5020 Unspecified systolic (congestive) heart failure: Secondary | ICD-10-CM

## 2023-01-05 DIAGNOSIS — Z7982 Long term (current) use of aspirin: Secondary | ICD-10-CM | POA: Diagnosis not present

## 2023-01-05 DIAGNOSIS — I679 Cerebrovascular disease, unspecified: Secondary | ICD-10-CM | POA: Diagnosis not present

## 2023-01-05 DIAGNOSIS — Z885 Allergy status to narcotic agent status: Secondary | ICD-10-CM | POA: Diagnosis not present

## 2023-01-05 DIAGNOSIS — E119 Type 2 diabetes mellitus without complications: Secondary | ICD-10-CM | POA: Diagnosis not present

## 2023-01-05 DIAGNOSIS — I472 Ventricular tachycardia, unspecified: Secondary | ICD-10-CM | POA: Diagnosis present

## 2023-01-05 DIAGNOSIS — Z6841 Body Mass Index (BMI) 40.0 and over, adult: Secondary | ICD-10-CM | POA: Diagnosis not present

## 2023-01-05 DIAGNOSIS — Z833 Family history of diabetes mellitus: Secondary | ICD-10-CM | POA: Diagnosis not present

## 2023-01-05 DIAGNOSIS — I130 Hypertensive heart and chronic kidney disease with heart failure and stage 1 through stage 4 chronic kidney disease, or unspecified chronic kidney disease: Secondary | ICD-10-CM | POA: Diagnosis present

## 2023-01-05 DIAGNOSIS — I63532 Cerebral infarction due to unspecified occlusion or stenosis of left posterior cerebral artery: Secondary | ICD-10-CM | POA: Diagnosis present

## 2023-01-05 DIAGNOSIS — I639 Cerebral infarction, unspecified: Secondary | ICD-10-CM | POA: Diagnosis not present

## 2023-01-05 DIAGNOSIS — I5022 Chronic systolic (congestive) heart failure: Secondary | ICD-10-CM

## 2023-01-05 DIAGNOSIS — G8191 Hemiplegia, unspecified affecting right dominant side: Secondary | ICD-10-CM | POA: Diagnosis present

## 2023-01-05 DIAGNOSIS — R531 Weakness: Secondary | ICD-10-CM | POA: Diagnosis present

## 2023-01-05 DIAGNOSIS — F17210 Nicotine dependence, cigarettes, uncomplicated: Secondary | ICD-10-CM | POA: Diagnosis present

## 2023-01-05 DIAGNOSIS — R29810 Facial weakness: Secondary | ICD-10-CM | POA: Diagnosis present

## 2023-01-05 DIAGNOSIS — Z716 Tobacco abuse counseling: Secondary | ICD-10-CM | POA: Diagnosis not present

## 2023-01-05 DIAGNOSIS — I493 Ventricular premature depolarization: Secondary | ICD-10-CM | POA: Diagnosis present

## 2023-01-05 DIAGNOSIS — N1831 Chronic kidney disease, stage 3a: Secondary | ICD-10-CM | POA: Diagnosis present

## 2023-01-05 DIAGNOSIS — I340 Nonrheumatic mitral (valve) insufficiency: Secondary | ICD-10-CM

## 2023-01-05 DIAGNOSIS — Z79899 Other long term (current) drug therapy: Secondary | ICD-10-CM | POA: Diagnosis not present

## 2023-01-05 DIAGNOSIS — F32A Depression, unspecified: Secondary | ICD-10-CM | POA: Diagnosis present

## 2023-01-05 DIAGNOSIS — Z87891 Personal history of nicotine dependence: Secondary | ICD-10-CM | POA: Diagnosis not present

## 2023-01-05 DIAGNOSIS — Z8249 Family history of ischemic heart disease and other diseases of the circulatory system: Secondary | ICD-10-CM | POA: Diagnosis not present

## 2023-01-05 DIAGNOSIS — I429 Cardiomyopathy, unspecified: Secondary | ICD-10-CM | POA: Diagnosis present

## 2023-01-05 DIAGNOSIS — I255 Ischemic cardiomyopathy: Secondary | ICD-10-CM

## 2023-01-05 DIAGNOSIS — E7800 Pure hypercholesterolemia, unspecified: Secondary | ICD-10-CM | POA: Diagnosis present

## 2023-01-05 DIAGNOSIS — E1122 Type 2 diabetes mellitus with diabetic chronic kidney disease: Secondary | ICD-10-CM | POA: Diagnosis present

## 2023-01-05 DIAGNOSIS — F410 Panic disorder [episodic paroxysmal anxiety] without agoraphobia: Secondary | ICD-10-CM | POA: Diagnosis present

## 2023-01-05 DIAGNOSIS — Z592 Discord with neighbors, lodgers and landlord: Secondary | ICD-10-CM | POA: Diagnosis not present

## 2023-01-05 DIAGNOSIS — Z8673 Personal history of transient ischemic attack (TIA), and cerebral infarction without residual deficits: Secondary | ICD-10-CM

## 2023-01-05 DIAGNOSIS — I6350 Cerebral infarction due to unspecified occlusion or stenosis of unspecified cerebral artery: Secondary | ICD-10-CM | POA: Diagnosis present

## 2023-01-05 DIAGNOSIS — I69351 Hemiplegia and hemiparesis following cerebral infarction affecting right dominant side: Secondary | ICD-10-CM | POA: Diagnosis not present

## 2023-01-05 DIAGNOSIS — I10 Essential (primary) hypertension: Secondary | ICD-10-CM | POA: Diagnosis not present

## 2023-01-05 DIAGNOSIS — Z733 Stress, not elsewhere classified: Secondary | ICD-10-CM | POA: Diagnosis not present

## 2023-01-05 LAB — BASIC METABOLIC PANEL
Anion gap: 7 (ref 5–15)
BUN: 18 mg/dL (ref 6–20)
CO2: 23 mmol/L (ref 22–32)
Calcium: 8.9 mg/dL (ref 8.9–10.3)
Chloride: 108 mmol/L (ref 98–111)
Creatinine, Ser: 1 mg/dL (ref 0.44–1.00)
GFR, Estimated: >60 mL/min (ref >60)
Glucose, Bld: 117 mg/dL — ABNORMAL HIGH (ref 70–99)
Potassium: 3.9 mmol/L (ref 3.5–5.1)
Sodium: 138 mmol/L (ref 135–145)

## 2023-01-05 LAB — BETA-2-GLYCOPROTEIN I ABS, IGG/M/A
Beta-2 Glyco I IgG: <9 GPI IgG units (ref 0–20)
Beta-2-Glycoprotein I IgA: <9 GPI IgA units (ref 0–25)
Beta-2-Glycoprotein I IgM: <9 GPI IgM units (ref 0–32)

## 2023-01-05 LAB — LUPUS ANTICOAGULANT PANEL
DRVVT: 36.2 s (ref 0.0–47.0)
PTT Lupus Anticoagulant: 31.3 s (ref 0.0–43.5)

## 2023-01-05 LAB — HOMOCYSTEINE: Homocysteine: 14.6 umol/L — ABNORMAL HIGH (ref 0.0–14.5)

## 2023-01-05 MED ORDER — EMPAGLIFLOZIN 10 MG PO TABS
10.0000 mg | ORAL_TABLET | Freq: Every day | ORAL | Status: DC
  Administered 2023-01-05 – 2023-01-06 (×2): 10 mg via ORAL
  Filled 2023-01-05 (×2): qty 1

## 2023-01-05 MED ORDER — GADOBUTROL 1 MMOL/ML IV SOLN
10.0000 mL | Freq: Once | INTRAVENOUS | Status: AC | PRN
  Administered 2023-01-05: 10 mL via INTRAVENOUS

## 2023-01-05 MED ORDER — METOPROLOL SUCCINATE ER 50 MG PO TB24
50.0000 mg | ORAL_TABLET | Freq: Every day | ORAL | Status: DC
  Administered 2023-01-06: 50 mg via ORAL
  Filled 2023-01-05: qty 1

## 2023-01-05 NOTE — Progress Notes (Signed)
HD#0 Subjective:   Summary: This is a 52 year old female with prior CVA who presents to the emergency department with right upper and lower extremity weakness.  Patient admitted for subacute left temporal lobe CVA.  Overnight Events: No overnight events  Patient evaluated bedside this morning.  She states she is doing well this morning.  She is sitting up eating her breakfast this morning.  She denies any shortness of breath or chest pain.  She states her right-sided weakness is improving.  She denies any trouble swallowing, speaking, or finding words.  She has been up and moving with no concerns.  Objective:  Vital signs in last 24 hours: Vitals:   01/04/23 2220 01/04/23 2333 01/05/23 0339 01/05/23 1000  BP:  104/78 (!) 92/58 (!) 102/41  Pulse:  95 78 (!) 44  Resp:   16 18  Temp:  98.1 F (36.7 C) 97.8 F (36.6 C) 98 F (36.7 C)  TempSrc:  Oral Oral Oral  SpO2:  100% 99% 99%  Weight:      Height: 5\' 6"  (1.676 m)      Supplemental O2: Room Air SpO2: 99 %   Physical Exam:  Constitutional: Sitting up in bed, eating breakfast, no acute distress HENT: normocephalic atraumatic Eyes: EOMI Cardiovascular: regular rate and rhythm, no m/r/g Pulmonary/Chest: normal work of breathing on room air, lungs clear to auscultation bilaterally Neurological: 5/5 strength noted to right upper and lower extremity.  5/5 strength noted to left upper and lower extremity.  5/5 strength noted to dorsiflexion and plantarflexion bilaterally.  Sensation intact.  Filed Weights   01/03/23 0328  Weight: 113.4 kg     Intake/Output Summary (Last 24 hours) at 01/05/2023 1224 Last data filed at 01/05/2023 1000 Gross per 24 hour  Intake 480 ml  Output --  Net 480 ml   Net IO Since Admission: 480 mL [01/05/23 1224]  Pertinent Labs:    Latest Ref Rng & Units 01/04/2023   12:48 AM 01/03/2023    3:32 AM 01/03/2023    3:31 AM  CBC  WBC 4.0 - 10.5 K/uL 5.7   6.6   Hemoglobin 12.0 - 15.0 g/dL 95.2   84.1  32.4   Hematocrit 36.0 - 46.0 % 38.7  43.0  39.8   Platelets 150 - 400 K/uL 298   307        Latest Ref Rng & Units 01/05/2023    3:34 AM 01/04/2023   12:48 AM 01/03/2023    3:32 AM  CMP  Glucose 70 - 99 mg/dL 401  027  253   BUN 6 - 20 mg/dL 18  16  20    Creatinine 0.44 - 1.00 mg/dL 6.64  4.03  4.74   Sodium 135 - 145 mmol/L 138  137  140   Potassium 3.5 - 5.1 mmol/L 3.9  3.8  3.9   Chloride 98 - 111 mmol/L 108  107  110   CO2 22 - 32 mmol/L 23  21    Calcium 8.9 - 10.3 mg/dL 8.9  8.7      Imaging: No results found.  Assessment/Plan:   Principal Problem:   Acute CVA (cerebrovascular accident) (HCC) Active Problems:   HTN (hypertension)   HLD (hyperlipidemia)   Type 2 diabetes mellitus (HCC)   Right sided weakness   Patient Summary: This is a 52 year old female with a past medical history of prior CVA who presented to the emergency department with right upper and lower extremity weakness.  Patient admitted  for subacute left temporal lobe CVA found to have worsening HFrEF.  #Heart failure with reduced ejection fraction #Mitral regurgitation Upon evaluation for source of CVA, patient found to have worsening ejection fraction of left ventricle at 20 to 25%.  Currently no concern for exacerbation at this time.  Etiology unclear, less likely ischemic related.  Will get further workup with cardiac MRI.  Etiology could be amyloid deposits.  Could also be related to monitor regurgitation.  Could also be related to underlying arrhythmia induced.  Cardiology following.  Patient currently on metoprolol succinate 50 mg daily.  Will continue to titrate up GDMT medications.  Anticipate adding SGLT2 towards discharge.  Currently watching out lisinopril and planning to start Entresto. -Continue metoprolol succinate 50 mg daily -Anticipate titrating up GDMT -Follow-up TEE -Follow-up cardiac MRI -Cardiology following, appreciate recs  #Subacute CVA of L temporal lobe #History of  previous CVA Patient evaluated at bedside this morning.  She states she is doing well.  On exam, strength has returned back to baseline.  Patient is up and walking well.  Patient worked with OT who is recommending outpatient therapy.  No further weakness appreciated.  Will evaluate cardioembolic cause with TEE tomorrow.  TTE did show heart failure with reduced ejection fraction, see plan above.   -Continue atorvastatin 40 mg daily -On discharge can start patient with metformin -Neurology following, appreciate recommendations -PT/OT following -TEE pending -Monitor for further neurologic deficits -Continue aspirin and Plavix day 3 of 21, followed by Plavix alone (of note, will reach out to neurology to see if potentially patient can transition to anticoagulation with Eliquis after TEE tomorrow)  #Hypertension Blood pressure measuring well.  Given heart failure reduced ejection fraction, will discontinue lisinopril in anticipation of starting Entresto.  Continue metoprolol succinate 50 mg daily -Continue metoprolol succinate 50 mg daily -Monitor blood pressure closely -Titrate up GDMT medications as blood pressure tolerates   #HLD LDL above goal. -Continue atorvastatin 40 mg daily  -Encourage patient to be compliant   #Type 2 diabetes mellitus A1c resulted at 8.0. -Will start patient on metformin outpatient  #CKD stage IIIa On further chart review, patient does seem to have elevated creatinine at baseline around 1.1-1.2.  Creatinine at baseline this morning.  Will continue to monitor. -As we titrate up GDMT, I do anticipate some decrease in kidney function initially.   Diet: Normal IVF: None,None VTE: Enoxaparin Code: Full PT/OT recs: Pending, none Family Update: Son and daughter updated at bedside  Dispo: Anticipated discharge to Home in 2 days pending TEE and clinical improvement.   Modena Slater DO Internal Medicine Resident PGY-1 [phone] Please contact the on call pager after  5 pm and on weekends at [phone].

## 2023-01-05 NOTE — Evaluation (Signed)
Occupational Therapy Evaluation Patient Details Name: Melody Robles MRN: [id number] DOB: 04-14-1971 Today's Date: 01/05/2023   History of Present Illness 52 y.o. F with, who presented to [facility] ED 6/23 with R sided weakness and facial droop, found to have subacute L temporal lobe CVA.  PMH multiple CVA, HTN, HLD, pre-DM, depression   Clinical Impression   PTA, pt lives with family, typically Independent in all ADLs/mobility without need for AD. Pt works for [employer], now out for the summer. Pt presents with persistent R sided weakness though reports improvements since admission and able to use extremities functionally. Pt able to mobilize in room without AD and manage ADLs without physical assistance. Pt's children present, confirm ability to provide 24/7 assist at DC if needed. Provided UE HEP with written handout (w/ theraband and squeeze ball) to further address R UE strength in next sessions. Recommend OP OT if R UE deficits persist.      Recommendations for follow up therapy are one component of a multi-disciplinary discharge planning process, led by the attending physician.  Recommendations may be updated based on patient status, additional functional criteria and insurance authorization.   Assistance Recommended at Discharge PRN  Patient can return home with the following Assist for transportation;Assistance with cooking/housework    Functional Status Assessment  Patient has had a recent decline in their functional status and demonstrates the ability to make significant improvements in function in a reasonable and predictable amount of time.  Equipment Recommendations  None recommended by OT    Recommendations for Other Services       Precautions / Restrictions Precautions Precautions: Fall Precaution Comments: low fall risk Restrictions Weight Bearing Restrictions: No      Mobility Bed Mobility Overal bed mobility: Modified Independent                   Transfers Overall transfer level: Independent Equipment used: None Transfers: Sit to/from Stand Sit to Stand: Independent                  Balance Overall balance assessment: Needs assistance Sitting-balance support: Feet supported, No upper extremity supported Sitting balance-Leahy Scale: Good     Standing balance support: During functional activity, No upper extremity supported Standing balance-Leahy Scale: Good Standing balance comment: no AD or LOB for short distances                           ADL either performed or assessed with clinical judgement   ADL Overall ADL's : Modified independent                                       General ADL Comments: able to mobilize to/from bathroom without AD, simulate tub transfers in bathroom (reports always having family nearby or assisting with tub transfers at home for safety), able to stand and brush teeth at sink. attempted to further assess dynamic balance by picking up items from floor though pt declined - reported her MD told her to never bend over to pick anything up since last stroke (?). discussed use of reacher, etc but pt reports her son (present) is her reacher and picks up all items from floor. educated on BE FAST stroke signs, provided UE HEP with theraband/squeeze ball for pt to address RUE weakness     Vision Baseline Vision/History: 1 Wears glasses Ability  to See in Adequate Light: 0 Adequate Patient Visual Report: No change from baseline Vision Assessment?: No apparent visual deficits     Perception     Praxis      Pertinent Vitals/Pain Pain Assessment Pain Assessment: No/denies pain     Hand Dominance Right   Extremity/Trunk Assessment Upper Extremity Assessment Upper Extremity Assessment: RUE deficits/detail RUE Deficits / Details: AROM WFL, 4-/5 grossly with notable weakness in comparison to nondominant L UE RUE Sensation: WNL RUE Coordination: WNL   Lower  Extremity Assessment Lower Extremity Assessment: Defer to PT evaluation   Cervical / Trunk Assessment Cervical / Trunk Assessment: Normal   Communication Communication Communication: No difficulties   Cognition Arousal/Alertness: Awake/alert Behavior During Therapy: WFL for tasks assessed/performed Overall Cognitive Status: Within Functional Limits for tasks assessed                                       General Comments  Children at bedside    Exercises     Shoulder Instructions      Home Living Family/patient expects to be discharged to:: Private residence Living Arrangements: Children Available Help at Discharge: Available 24 hours/day;Family Type of Home: Apartment Home Access: Level entry     Home Layout: One level     Bathroom Shower/Tub: Chief Strategy Officer: Standard Bathroom Accessibility: Yes   Home Equipment: None          Prior Functioning/Environment Prior Level of Function : Independent/Modified Independent;Working/employed             Mobility Comments: working for [employer] until Jun 7, since then limited mobility sleeping a lot ADLs Comments: independent        OT Problem List: Decreased strength;Decreased activity tolerance      OT Treatment/Interventions: Self-care/ADL training;Therapeutic exercise;DME and/or AE instruction;Therapeutic activities    OT Goals(Current goals can be found in the care plan section) Acute Rehab OT Goals Patient Stated Goal: find ways to be active now that schoolwork out for summer OT Goal Formulation: With patient Time For Goal Achievement: 01/19/23 Potential to Achieve Goals: Good ADL Goals Pt/caregiver will Perform Home Exercise Program: Increased strength;Right Upper extremity;With theraband;Independently;With written HEP provided  OT Frequency: Min 2X/week    Co-evaluation              AM-PAC OT "6 Clicks" Daily Activity     Outcome Measure Help  from another person eating meals?: None Help from another person taking care of personal grooming?: None Help from another person toileting, which includes using toliet, bedpan, or urinal?: None Help from another person bathing (including washing, rinsing, drying)?: None Help from another person to put on and taking off regular upper body clothing?: None Help from another person to put on and taking off regular lower body clothing?: None 6 Click Score: 24   End of Session Equipment Utilized During Treatment: Gait belt Nurse Communication: Mobility status  Activity Tolerance: Patient tolerated treatment well Patient left: in bed;with call bell/phone within reach;with family/visitor present;Other (comment) (EOB eating breakfast)  OT Visit Diagnosis: Muscle weakness (generalized) (M62.81)                Time: 1610-9604 OT Time Calculation (min): 25 min Charges:  OT General Charges $OT Visit: 1 Visit OT Evaluation $OT Eval Low Complexity: 1 Low OT Treatments $Self Care/Home Management : 8-22 mins  Raynelle Fanning B, OTR/L Acute  Rehab Services Office: [phone]   Lorre Munroe 01/05/2023, 9:44 AM

## 2023-01-05 NOTE — Care Management Obs Status (Signed)
MEDICARE OBSERVATION STATUS NOTIFICATION   Patient Details  Name: Melody Robles MRN: [id number] Date of Birth: 1970-10-28   Medicare Observation Status Notification Given:  Yes    Kermit Balo, RN 01/05/2023, 11:31 AM

## 2023-01-05 NOTE — Progress Notes (Signed)
Progress Note  Patient Name: YAJAHIRA TISON Date of Encounter: 01/05/2023 Primary Cardiologist: None   Subjective   Overnight has CMR with no issues. No CP, SOB, Palpitations. She is still adamant that the etiology of her strokes are related to the trauma her children went through, but she is amenable to getting checked out  Vital Signs    Vitals:   01/04/23 1932 01/04/23 2220 01/04/23 2333 01/05/23 0339  BP: 100/79  104/78 (!) 92/58  Pulse: 95  95 78  Resp: 18   16  Temp: 97.8 F (36.6 C)  98.1 F (36.7 C) 97.8 F (36.6 C)  TempSrc: Oral  Oral Oral  SpO2: 100%  100% 99%  Weight:      Height:  5\' 6"  (1.676 m)     No intake or output data in the 24 hours ending 01/05/23 0957 Filed Weights   01/03/23 0328  Weight: 113.4 kg    Physical Exam   GEN: No acute distress.   Neck: No JVD Cardiac: RRR, systolic murmur no rubs, or gallops.  Respiratory: Clear to auscultation bilaterally. GI: Soft, nontender, non-distended  MS: No edema  Labs   Telemetry: SR with frequent PVCs and NSVT (4-5 beats(   Chemistry Recent Labs  Lab 01/03/23 0331 01/03/23 0332 01/04/23 0048 01/05/23 0334  NA 137 140 137 138  K 3.9 3.9 3.8 3.9  CL 105 110 107 108  CO2 18*  --  21* 23  GLUCOSE 158* 152* 110* 117*  BUN 18 20 16 18   CREATININE 1.35* 1.20* 1.14* 1.00  CALCIUM 8.9  --  8.7* 8.9  PROT 7.1  --   --   --   ALBUMIN 3.4*  --   --   --   AST 23  --   --   --   ALT 30  --   --   --   ALKPHOS 82  --   --   --   BILITOT 0.5  --   --   --   GFRNONAA 48*  --  59* >60  ANIONGAP 14  --  9 7     Hematology Recent Labs  Lab 01/03/23 0331 01/03/23 0332 01/04/23 0048  WBC 6.6  --  5.7  RBC 4.92  --  4.71  HGB 12.5 14.6 12.0  HCT 39.8 43.0 38.7  MCV 80.9  --  82.2  MCH 25.4*  --  25.5*  MCHC 31.4  --  31.0  RDW 14.8  --  14.4  PLT 307  --  298    Cardiac EnzymesNo results for input(s): "TROPONINI" in the last 168 hours. No results for input(s): "TROPIPOC" in the  last 168 hours.   BNPNo results for input(s): "BNP", "PROBNP" in the last 168 hours.   DDimer No results for input(s): "DDIMER" in the last 168 hours.   Cardiac Studies   Cardiac Studies & Procedures       ECHOCARDIOGRAM  ECHOCARDIOGRAM COMPLETE BUBBLE STUDY 01/04/2023  Narrative ECHOCARDIOGRAM REPORT    Patient Name:   ELEN ACERO Date of Exam: 01/04/2023 Medical Rec #:  [id number]          Height:       66.0 in Accession #:    [id number]         Weight:       250.0 lb Date of Birth:  [birth year]          BSA:  2.199 m Patient Age:    52 years           BP:           142/89 mmHg Patient Gender: F                  HR:           93 bpm. Exam Location:  Inpatient  Procedure: 2D Echo, Cardiac Doppler, Color Doppler, Saline Contrast Bubble Study and Intracardiac Opacification Agent  Indications:    stroke  History:        Patient has prior history of Echocardiogram examinations, most recent 08/06/2020. Stroke; Risk Factors:Current Smoker, Hypertension, Diabetes and Dyslipidemia.  Sonographer:    Delcie Roch RDCS Referring Phys: [id number] [facility]  IMPRESSIONS   1. Left ventricular ejection fraction, by estimation, is 20 to 25%. The left ventricle has severely decreased function. The left ventricle demonstrates global hypokinesis. The left ventricular internal cavity size was severely dilated. Left ventricular diastolic parameters are indeterminate. 2. Right ventricular systolic function is normal. The right ventricular size is normal. Tricuspid regurgitation signal is inadequate for assessing PA pressure. 3. Left atrial size was mildly dilated. 4. The mitral valve is abnormal. Moderate to severe mitral valve regurgitation. No evidence of mitral stenosis. 5. The aortic valve is tricuspid. Aortic valve regurgitation is not visualized. No aortic stenosis is present. 6. The inferior vena cava is normal in size with greater than 50% respiratory  variability, suggesting right atrial pressure of 3 mmHg. 7. Agitated saline contrast bubble study was negative, with no evidence of any interatrial shunt.  Comparison(s): Prior images unable to be directly viewed, comparison made by report only. Changes from prior study are noted. Prior echo 08/06/20 at [facility] with reported EF 40-45%, worse on current study.  Conclusion(s)/Recommendation(s): Severely dilated LV, with severely reduced LVEF. Very eccentric MR, likely moderate-severe. Findings communicated to primary team.  FINDINGS Left Ventricle: Left ventricular ejection fraction, by estimation, is 20 to 25%. The left ventricle has severely decreased function. The left ventricle demonstrates global hypokinesis. Definity contrast agent was given IV to delineate the left ventricular endocardial borders. The left ventricular internal cavity size was severely dilated. There is no left ventricular hypertrophy. Left ventricular diastolic parameters are indeterminate.  Right Ventricle: The right ventricular size is normal. No increase in right ventricular wall thickness. Right ventricular systolic function is normal. Tricuspid regurgitation signal is inadequate for assessing PA pressure.  Left Atrium: Left atrial size was mildly dilated.  Right Atrium: Right atrial size was normal in size.  Pericardium: There is no evidence of pericardial effusion.  Mitral Valve: Highly eccentric MR jet, moderate-severe. The mitral valve is abnormal. There is mild thickening of the mitral valve leaflet(s). There is mild calcification of the mitral valve leaflet(s). Moderate to severe mitral valve regurgitation. No evidence of mitral valve stenosis.  Tricuspid Valve: The tricuspid valve is grossly normal. Tricuspid valve regurgitation is trivial. No evidence of tricuspid stenosis.  Aortic Valve: The aortic valve is tricuspid. Aortic valve regurgitation is not visualized. No aortic stenosis is present.  Pulmonic Valve:  The pulmonic valve was not well visualized. Pulmonic valve regurgitation is trivial. No evidence of pulmonic stenosis.  Aorta: The aortic root, ascending aorta and aortic arch are all structurally normal, with no evidence of dilitation or obstruction.  Venous: The inferior vena cava is normal in size with greater than 50% respiratory variability, suggesting right atrial pressure of 3 mmHg.  IAS/Shunts: The atrial septum is grossly  normal. Agitated saline contrast was given intravenously to evaluate for intracardiac shunting. Agitated saline contrast bubble study was negative, with no evidence of any interatrial shunt.   LEFT VENTRICLE PLAX 2D LVIDd:         7.10 cm      Diastology LVIDs:         6.36 cm      LV e' medial:    7.18 cm/s LV PW:         1.00 cm      LV E/e' medial:  14.3 LV IVS:        0.90 cm      LV e' lateral:   6.09 cm/s LVOT diam:     2.20 cm      LV E/e' lateral: 16.9 LV SV:         44 LV SV Index:   20 LVOT Area:     3.80 cm  LV Volumes (MOD) LV vol d, MOD A2C: 246.0 ml LV vol s, MOD A2C: 174.0 ml LV SV MOD A2C:     72.0 ml  RIGHT VENTRICLE             IVC RV Basal diam:  2.10 cm     IVC diam: 2.00 cm RV S prime:     10.20 cm/s TAPSE (M-mode): 1.9 cm  LEFT ATRIUM             Index        RIGHT ATRIUM          Index LA diam:        3.80 cm 1.73 cm/m   RA Area:     9.43 cm LA Vol (A2C):   65.7 ml 29.88 ml/m  RA Volume:   17.70 ml 8.05 ml/m LA Vol (A4C):   64.1 ml 29.15 ml/m LA Biplane Vol: 68.4 ml 31.11 ml/m AORTIC VALVE LVOT Vmax:   75.40 cm/s LVOT Vmean:  44.400 cm/s LVOT VTI:    0.116 m  AORTA Ao Root diam: 2.70 cm Ao Asc diam:  3.00 cm  MR Peak grad: 124.5 mmHg MR Mean grad: 77.0 mmHg     SHUNTS MR Vmax:      558.00 cm/s   Systemic VTI:  0.12 m MR Vmean:     410.0 cm/s    Systemic Diam: 2.20 cm MV E velocity: 103.00 cm/s MV A velocity: 136.00 cm/s MV E/A ratio:  0.76  Jodelle Red MD Electronically signed by Jodelle Red MD Signature Date/Time: 01/04/2023/3:40:08 PM    Final             Assessment & Plan   Chronic combined systolic heart failure - LVEF < 30% with LV dilation; CMR is performed and results are pending - diuretics: euvolemic; hydrochlorothiazide held with no plans to restrt inpatient - lisinopril has been held for the future; last given 1441 on 01/04/23; we will plan to give Entresto  24/26 on AM of 01/06/23 vs ARB if persistent low BP - Increase succinate dose today given increased ectopy - starting jardiance today - MRA planned for 01/06/23 based on BP     CVA with prior history - no LV thrombus, brain MRI did not described embolic milieu - neurology recommended TEE; this is planned for AM 01/06/23 - NPO at midnight; consented - DAPT and statin as per neurology team  Frequent PVCs - BB as above  Moderate to Severe MR for horizontal color splay - CMR and TEE are pending; suspect  etiology is ventricular functional    For questions or updates, please contact [facility] Please consult [email or web address] for contact info under Cardiology/STEMI.      Riley Lam, MD FASE [facility] Cardiologist [facility]  [address] [place], [address] [place], [place] [address] [phone]  9:57 AM

## 2023-01-05 NOTE — Progress Notes (Addendum)
Physical Therapy Treatment Patient Details Name: Melody Robles MRN: [id number] DOB: May 14, 1971 Today's Date: 01/05/2023   History of Present Illness 52 y.o. F with, who presented to [facility] ED 6/23 with R sided weakness and facial droop, found to have subacute L temporal lobe CVA.  PMH multiple CVA, HTN, HLD, pre-DM, depression    PT Comments    Today pt is adequate for discharge with functional mobility goals. Pt amb 200' with supervision and no reports of dizziness, difficulty with RLE dorsiflexion to perform foot clearance but with no LOB. Pt agreeable to improvements with gait, balance, and endurance provided by the RW. Pt compliant with current OP PT rec following discharge to facilitate improvements in gait training and progressive functional mobility/endurance. At this time pt no longer requires skilled PT, will d/c. Thank you for the consult.   Recommendations for follow up therapy are one component of a multi-disciplinary discharge planning process, led by the attending physician.  Recommendations may be updated based on patient status, additional functional criteria and insurance authorization.  Follow Up Recommendations       Assistance Recommended at Discharge Set up Supervision/Assistance  Patient can return home with the following A little help with walking and/or transfers;Assist for transportation;Help with stairs or ramp for entrance;Assistance with cooking/housework   Equipment Recommendations  Rolling walker (2 wheels)    Recommendations for Other Services       Precautions / Restrictions Precautions Precautions: Fall Restrictions Weight Bearing Restrictions: No     Mobility  Bed Mobility Overal bed mobility: Modified Independent             General bed mobility comments: increased time, use of bed rail    Transfers Overall transfer level: Modified independent Equipment used: Rolling walker (2 wheels) Transfers: Sit to/from Stand Sit to Stand:  Modified independent (Device/Increase time)           General transfer comment: Adequate weight shifting to achieve upright stand, correct hand placements for transfer to RW with no cues    Ambulation/Gait Ambulation/Gait assistance: Supervision Gait Distance (Feet): 200 Feet Assistive device: Rolling walker (2 wheels), None Gait Pattern/deviations: Step-through pattern, Decreased step length - right, Decreased dorsiflexion - right Gait velocity: slowed Gait velocity interpretation: 1.31 - 2.62 ft/sec, indicative of limited community ambulator   General Gait Details: Difficulty with R dorsiflexion, intermittent dragging with front of shoe but able to have foot clearance >75% of the time   Stairs             Wheelchair Mobility    Modified Rankin (Stroke Patients Only) Modified Rankin (Stroke Patients Only) Pre-Morbid Rankin Score: No symptoms Modified Rankin: Slight disability     Balance Overall balance assessment: Needs assistance Sitting-balance support: Feet supported, No upper extremity supported Sitting balance-Leahy Scale: Good Sitting balance - Comments: Able to reach outside limits of stability to grab items off the tray   Standing balance support: Bilateral upper extremity supported, During functional activity Standing balance-Leahy Scale: Good Standing balance comment: Able to stand unsupported at the sink for ADL's, grasps for balance on counter or RW intermittently                            Cognition Arousal/Alertness: Awake/alert Behavior During Therapy: WFL for tasks assessed/performed Overall Cognitive Status: Within Functional Limits for tasks assessed  Exercises      General Comments General comments (skin integrity, edema, etc.): No reports of dizziness throughout session      Pertinent Vitals/Pain Pain Assessment Pain Assessment: No/denies pain    Home Living                           Prior Function            PT Goals (current goals can now be found in the care plan section) Acute Rehab PT Goals Patient Stated Goal: get out of the hospital PT Goal Formulation: All assessment and education complete, DC therapy Progress towards PT goals: Goals met/education completed, patient discharged from PT    Frequency    Min 3X/week      PT Plan Discharge plan needs to be updated    Co-evaluation              AM-PAC PT "6 Clicks" Mobility   Outcome Measure  Help needed turning from your back to your side while in a flat bed without using bedrails?: None Help needed moving from lying on your back to sitting on the side of a flat bed without using bedrails?: None Help needed moving to and from a bed to a chair (including a wheelchair)?: None Help needed standing up from a chair using your arms (e.g., wheelchair or bedside chair)?: None Help needed to walk in hospital room?: A Little Help needed climbing 3-5 steps with a railing? : A Little 6 Click Score: 22    End of Session Equipment Utilized During Treatment: Gait belt Activity Tolerance: Patient tolerated treatment well Patient left: in chair;with call bell/phone within reach Nurse Communication: Mobility status PT Visit Diagnosis: Muscle weakness (generalized) (M62.81);Hemiplegia and hemiparesis;Unsteadiness on feet (R26.81) Hemiplegia - Right/Left: Right Hemiplegia - dominant/non-dominant: Dominant Hemiplegia - caused by: Cerebral infarction     Time: 7829-5621 PT Time Calculation (min) (ACUTE ONLY): 25 min  Charges:  $Therapeutic Activity: 23-37 mins                     Hendricks Milo, SPT  Acute Rehabilitation Services    Hendricks Milo 01/05/2023, 1:52 PM
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
## 2023-01-05 NOTE — Progress Notes (Signed)
   Heart Failure Stewardship Pharmacist Progress Note   PCP: [facility] PCP-Cardiologist: None    HPI:  52 yo F with PMH of CHF, CVA, HTN, pre-diabetes, tobacco use, obesity, and HLD.  Admitted with CVA in March 2021. EF at that time was 30-35%. No shunt on bubble study. No afib on Zio. Repeat ECHO 11/2019 was 30-35%. Cath ordered and showed normal coronary arteries. Last repeat ECHO 07/2020 showed EF 40-45%.   Presented to the ED on 6/23 as code stroke with reported slurred speech and R sided weakness. CT without acute intracranial abnormality. MRI concerning for left temporal subacute infarct.  ECHO showed LVEF 20-25%, global hypokinesis, RV normal, moderate to severe MR. cMRI today, results pending. Plan TEE on 6/26.   Current HF Medications: Beta Blocker: metoprolol XL 50 mg daily SGLT2i: Jardiance 10 mg daily  Prior to admission HF Medications: Beta blocker: metoprolol XL 25 mg daily ACE/ARB/ARNI: lisinopril 20 mg daily  Pertinent Lab Values: Serum creatinine 1.00, BUN 18, Potassium 3.9, Sodium 138, A1c 8.0   Vital Signs: Weight: 250 lbs Blood pressure: 90/50s  Heart rate: 80-90s  I/O: incomplete  Medication Assistance / Insurance Benefits Check: Does the patient have prescription insurance?  Yes Type of insurance plan: [place] Medicaid  Outpatient Pharmacy:  Prior to admission outpatient pharmacy: [facility] Is the patient willing to use [facility] TOC pharmacy at discharge? Yes Is the patient willing to transition their outpatient pharmacy to utilize a [facility] outpatient pharmacy?   Pending    Assessment: 1. Acute on chronic systolic CHF (LVEF 20-25%), pending etiology work up. NYHA class II symptoms. - Not volume overloaded on exam, no indication for diuretics. - Agree with increasing to metoprolol XL 50 mg daily - BP low - consider starting MRA and Entresto prior to discharge if BP improves - Agree with starting Jardiance 10 mg daily    Plan: 1) Medication changes recommended at this time: - Agree with changes  2) Patient assistance: - Jardiance copay $0 - Entresto copay $0  3)  Education  - Patient has been educated on current HF medications and potential additions to HF medication regimen - Patient verbalizes understanding that over the next few months, these medication doses may change and more medications may be added to optimize HF regimen - Patient has been educated on basic disease state pathophysiology and goals of therapy   Sharen Hones, PharmD, BCPS Heart Failure Stewardship Pharmacist Phone [phone]

## 2023-01-05 NOTE — Progress Notes (Signed)
   01/05/23 1455  Spiritual Encounters  Type of Visit Initial  Care provided to: Pt and family  Referral source Patient request  Reason for visit Advance directives  OnCall Visit No   Chaplain arrived in room to deliver AD education and found PT, son, daughter, and PT step-father in the room.  Chaplain asked if it was a good time to discuss AD and PT responded that it was.  Chaplain began explaining the HCPOA and PT was already very familiar as she is already the [facility] for her son.  Chaplain went on to discuss living will and PT indicated she understood.  Chaplain invited PT to look over paperwork with family and if they desired to fill it out to call us when they are ready to move forward with signatures.  Chaplain also informed PT that we can always come back and answer any questions if needed. Chaplain services remain available via paging Spiritual Care Department.

## 2023-01-05 NOTE — Progress Notes (Signed)
Patient returned to room from MRI
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
## 2023-01-05 NOTE — Progress Notes (Signed)
Patient sent off floor to MRI, per order. Patient received PO Ativan 1 mg by Ashok Cordia, RN prior to patient leaving floor. Patient transported by transport staff via bed.

## 2023-01-05 NOTE — TOC Initial Note (Signed)
Transition of Care [facility]) - Initial/Assessment Note    Patient Details  Name: Melody Robles MRN: [id number] Date of Birth: [birth year]  [facility] CM/SW Contact:    Kermit Balo, RN Phone Number: 01/05/2023, 11:34 AM  Clinical Narrative:                 Pt is from home with her children (adult). She has someone with her all the time.  No DME at home.  Daughters provide needed transportation. Pt manages her own medications and denies any issues.  Outpatient PT recommended. Pt prefers to attend at [facility]. CM will fax orders to [facility] outpatient therapy. TOC following.  Expected Discharge Plan: OP Rehab Barriers to Discharge: Continued Medical Work up   Patient Goals and CMS Choice     Choice offered to / list presented to : Patient      Expected Discharge Plan and Services   Discharge Planning Services: CM Consult Post Acute Care Choice: Durable Medical Equipment Living arrangements for the past 2 months: Apartment                 DME Arranged: Walker rolling DME Agency: [employer] Date DME Agency Contacted: 01/05/23   Representative spoke with at DME Agency: Zack            Prior Living Arrangements/Services Living arrangements for the past 2 months: Apartment Lives with:: Adult Children Patient language and need for interpreter reviewed:: Yes Do you feel safe going back to the place where you live?: Yes        Care giver support system in place?: Yes (comment)   Criminal Activity/Legal Involvement Pertinent to Current Situation/Hospitalization: No - Comment as needed  Activities of Daily Living Home Assistive Devices/Equipment: None ADL Screening (condition at time of admission) Patient's cognitive ability adequate to safely complete daily activities?: Yes Is the patient deaf or have difficulty hearing?: No Does the patient have difficulty seeing, even when wearing glasses/contacts?: No Does the patient have  difficulty concentrating, remembering, or making decisions?: No Patient able to express need for assistance with ADLs?: Yes Does the patient have difficulty dressing or bathing?: No Independently performs ADLs?: Yes (appropriate for developmental age) Does the patient have difficulty walking or climbing stairs?: No Weakness of Legs: Right Weakness of Arms/Hands: Right  Permission Sought/Granted                  Emotional Assessment Appearance:: Appears stated age Attitude/Demeanor/Rapport: Engaged Affect (typically observed): Accepting Orientation: : Oriented to Self, Oriented to Place, Oriented to  Time, Oriented to Situation   Psych Involvement: No (comment)  Admission diagnosis:  Right sided weakness [R53.1] Acute CVA (cerebrovascular accident) [facility]) [I63.9] Patient Active Problem List   Diagnosis Date Noted   Right sided weakness 01/04/2023   Acute CVA (cerebrovascular accident) (HCC) 01/03/2023   HTN (hypertension) 01/03/2023   HLD (hyperlipidemia) 01/03/2023   Type 2 diabetes mellitus (HCC) 01/03/2023   PCP:  [facility] Pharmacy:   [facility] [phone] - [place], [place] - [address] [place] [address] Phone: [phone] Fax: [phone]  [facility] [address] [place] [address] Phone: [phone] Fax: [phone]     Social Determinants of Health (SDOH) Social History: SDOH Screenings   Food Insecurity: No Food Insecurity (01/04/2023)  Housing: Low Risk  (01/04/2023)  Transportation Needs:  No Transportation Needs (01/04/2023)  Utilities: Not At Risk (01/04/2023)  Tobacco Use: High Risk (01/04/2023)   SDOH Interventions:     Readmission Risk Interventions     No data to display

## 2023-01-06 ENCOUNTER — Other Ambulatory Visit (HOSPITAL_COMMUNITY): Payer: Self-pay

## 2023-01-06 ENCOUNTER — Encounter: Payer: Self-pay | Admitting: *Deleted

## 2023-01-06 ENCOUNTER — Inpatient Hospital Stay (HOSPITAL_COMMUNITY): Payer: 59

## 2023-01-06 ENCOUNTER — Encounter (HOSPITAL_COMMUNITY)
Admission: EM | Disposition: A | Discharge status: Home / Self Care | Payer: Self-pay | Source: Home / Self Care | Attending: Internal Medicine

## 2023-01-06 ENCOUNTER — Other Ambulatory Visit: Payer: Self-pay | Admitting: Cardiology

## 2023-01-06 ENCOUNTER — Inpatient Hospital Stay (HOSPITAL_COMMUNITY): Payer: 59 | Admitting: Certified Registered Nurse Anesthetist

## 2023-01-06 ENCOUNTER — Encounter (HOSPITAL_COMMUNITY): Payer: Self-pay | Admitting: Internal Medicine

## 2023-01-06 DIAGNOSIS — I639 Cerebral infarction, unspecified: Secondary | ICD-10-CM

## 2023-01-06 DIAGNOSIS — I340 Nonrheumatic mitral (valve) insufficiency: Secondary | ICD-10-CM

## 2023-01-06 DIAGNOSIS — E119 Type 2 diabetes mellitus without complications: Secondary | ICD-10-CM

## 2023-01-06 DIAGNOSIS — I10 Essential (primary) hypertension: Secondary | ICD-10-CM

## 2023-01-06 DIAGNOSIS — I5020 Unspecified systolic (congestive) heart failure: Secondary | ICD-10-CM

## 2023-01-06 DIAGNOSIS — I679 Cerebrovascular disease, unspecified: Secondary | ICD-10-CM

## 2023-01-06 DIAGNOSIS — Z87891 Personal history of nicotine dependence: Secondary | ICD-10-CM

## 2023-01-06 DIAGNOSIS — I4729 Other ventricular tachycardia: Secondary | ICD-10-CM

## 2023-01-06 DIAGNOSIS — I493 Ventricular premature depolarization: Secondary | ICD-10-CM

## 2023-01-06 HISTORY — PX: TEE WITHOUT CARDIOVERSION: SHX5443

## 2023-01-06 LAB — CBC
HCT: 37.3 % (ref 36.0–46.0)
Hemoglobin: 11.6 g/dL — ABNORMAL LOW (ref 12.0–15.0)
MCH: 24.8 pg — ABNORMAL LOW (ref 26.0–34.0)
MCHC: 31.1 g/dL (ref 30.0–36.0)
MCV: 79.9 fL — ABNORMAL LOW (ref 80.0–100.0)
Platelets: 298 10*3/uL (ref 150–400)
RBC: 4.67 MIL/uL (ref 3.87–5.11)
RDW: 14.5 % (ref 11.5–15.5)
WBC: 6.2 10*3/uL (ref 4.0–10.5)
nRBC: 0 % (ref 0.0–0.2)

## 2023-01-06 LAB — BASIC METABOLIC PANEL
Anion gap: 9 (ref 5–15)
BUN: 17 mg/dL (ref 6–20)
CO2: 24 mmol/L (ref 22–32)
Calcium: 8.8 mg/dL — ABNORMAL LOW (ref 8.9–10.3)
Chloride: 105 mmol/L (ref 98–111)
Creatinine, Ser: 1.17 mg/dL — ABNORMAL HIGH (ref 0.44–1.00)
GFR, Estimated: 57 mL/min — ABNORMAL LOW (ref >60)
Glucose, Bld: 105 mg/dL — ABNORMAL HIGH (ref 70–99)
Potassium: 4.1 mmol/L (ref 3.5–5.1)
Sodium: 138 mmol/L (ref 135–145)

## 2023-01-06 LAB — ECHO TEE
MV M vel: 4.6 m/s
MV Peak grad: 84.6 mmHg
Radius: 0.7 cm

## 2023-01-06 LAB — CARDIOLIPIN ANTIBODIES, IGG, IGM, IGA
Anticardiolipin IgA: <9 APL U/mL (ref 0–11)
Anticardiolipin IgG: <9 GPL U/mL (ref 0–14)
Anticardiolipin IgM: <9 MPL U/mL (ref 0–12)

## 2023-01-06 SURGERY — ECHOCARDIOGRAM, TRANSESOPHAGEAL
Anesthesia: Monitor Anesthesia Care

## 2023-01-06 MED ORDER — APIXABAN 5 MG PO TABS: 5.0000 mg | ORAL_TABLET | Freq: Two times a day (BID) | ORAL | Status: DC

## 2023-01-06 MED ORDER — SPIRONOLACTONE 12.5 MG HALF TABLET
12.5000 mg | ORAL_TABLET | Freq: Every day | ORAL | Status: DC
  Administered 2023-01-06: 12.5 mg via ORAL
  Filled 2023-01-06: qty 1

## 2023-01-06 MED ORDER — EMPAGLIFLOZIN 10 MG PO TABS
10.0000 mg | ORAL_TABLET | Freq: Every day | ORAL | 0 refills | Status: DC
  Filled 2023-01-06: qty 30, 30d supply, fill #0

## 2023-01-06 MED ORDER — SODIUM CHLORIDE 0.9 % IV SOLN: INTRAVENOUS | Status: DC | PRN

## 2023-01-06 MED ORDER — SPIRONOLACTONE 25 MG PO TABS
12.5000 mg | ORAL_TABLET | Freq: Every day | ORAL | 0 refills | Status: DC
  Filled 2023-01-06: qty 15, 30d supply, fill #0

## 2023-01-06 MED ORDER — METFORMIN HCL 500 MG PO TABS
500.0000 mg | ORAL_TABLET | Freq: Every day | ORAL | 0 refills | Status: DC
  Filled 2023-01-06: qty 30, 30d supply, fill #0

## 2023-01-06 MED ORDER — APIXABAN 5 MG PO TABS
5.0000 mg | ORAL_TABLET | Freq: Two times a day (BID) | ORAL | 0 refills | Status: DC
  Filled 2023-01-06: qty 60, 30d supply, fill #0

## 2023-01-06 MED ORDER — LIDOCAINE 2% (20 MG/ML) 5 ML SYRINGE
INTRAMUSCULAR | Status: DC | PRN
  Administered 2023-01-06: 50 mg via INTRAVENOUS

## 2023-01-06 MED ORDER — ONDANSETRON HCL 4 MG PO TABS: 4.0000 mg | ORAL_TABLET | Freq: Every day | ORAL | Status: DC | PRN

## 2023-01-06 MED ORDER — PROPOFOL 500 MG/50ML IV EMUL
INTRAVENOUS | Status: DC | PRN
  Administered 2023-01-06: 125 ug/kg/min via INTRAVENOUS

## 2023-01-06 MED ORDER — METOPROLOL SUCCINATE ER 50 MG PO TB24
50.0000 mg | ORAL_TABLET | Freq: Every day | ORAL | 0 refills | Status: DC
  Filled 2023-01-06: qty 30, 30d supply, fill #0

## 2023-01-06 MED ORDER — ATORVASTATIN CALCIUM 40 MG PO TABS
40.0000 mg | ORAL_TABLET | Freq: Every day | ORAL | 0 refills | Status: DC
  Filled 2023-01-06: qty 30, 30d supply, fill #0

## 2023-01-06 NOTE — CV Procedure (Signed)
INDICATIONS: Stroke  PROCEDURE:   Informed consent was obtained prior to the procedure. The risks, benefits and alternatives for the procedure were discussed and the patient comprehended these risks.  Risks include, but are not limited to, cough, sore throat, vomiting, nausea, somnolence, esophageal and stomach trauma or perforation, bleeding, low blood pressure, aspiration, pneumonia, infection, trauma to the teeth and death.    After a procedural time-out, the oropharynx was anesthetized with 20% benzocaine spray.   During this procedure the patient was administered propofol, see anesthesia's note, to achieve and maintain moderate conscious sedation.  The patient's heart rate, blood pressure, and oxygen saturationweare monitored continuously during the procedure. The period of conscious sedation was 15 minutes, of which I was present face-to-face 100% of this time.  The transesophageal probe was inserted in the esophagus and stomach without difficulty and multiple views were obtained.  The patient was kept under observation until the patient left the procedure room.  The patient left the procedure room in stable condition.   Agitated microbubble saline contrast was administered.  COMPLICATIONS:    There were no immediate complications.  FINDINGS:  Severely dilated LV with severe LV dysfunction Severe functional mitral regurgitation Negative bubble study No LV thrombus, no LAA thrombus   RECOMMENDATIONS:     Continue medical management of MR/stroke   Time Spent Directly with the Patient:  15 minutes   Maisie Fus 01/06/2023, 9:10 AM

## 2023-01-06 NOTE — TOC Benefit Eligibility Note (Signed)
Pharmacy Patient Advocate Encounter  Insurance verification completed.    The patient is insured through [employer] Medicare Part D  Ran test claim for Eliquis 5 mg and the current 30 day co-pay is $0.00.   This test claim was processed through [facility]- copay amounts may vary at other pharmacies due to pharmacy/plan contracts, or as the patient moves through the different stages of their insurance plan.    Ronn Smolinsky, CPHT Pharmacy Patient Advocate Specialist [place] [facility] Patient Advocate Team Direct Number: [phone]  Fax: [phone] 

## 2023-01-06 NOTE — Progress Notes (Signed)
Pharmacist rounding with the Internal Medicine Teaching Service asked to provide patient education written materials and discussion regarding her new medications of apixaban, metformin, empagliflozin and sprironolactone.Written materials provided, discussion of each drug's dose, dosing interval, indication of use has been provided. Patient was given an opportunity to have asked and answered any questions regarding any of her new medications. New medications will be provided to the patient at the bedside before he discharge by our Transitions of Care Pharmacy.

## 2023-01-06 NOTE — Progress Notes (Signed)
Patient enrolled for [employer]/ [employer] to ship a 30 day cardiac event monitor to her address on file.  Letter with instructions mailed to patient.  Dr. Chandrasekhar to read. 

## 2023-01-06 NOTE — Progress Notes (Signed)
STROKE TEAM PROGRESS NOTE   SUBJECTIVE (INTERVAL HISTORY) Her son is at the bedside. She is sitting at the edge of bed for breakfast. TEE done earlier today unremarkable. Cardiac MRI showed EF 19% and possible LV noncompaction    OBJECTIVE Temp:  [97.7 F (36.5 C)-98.2 F (36.8 C)] 97.8 F (36.6 C) (06/26 1207) Pulse Rate:  [48-94] 69 (06/26 1207) Cardiac Rhythm: Normal sinus rhythm (06/26 0712) Resp:  [16-19] 18 (06/26 1207) BP: (100-123)/(62-90) 105/82 (06/26 1207) SpO2:  [96 %-100 %] 99 % (06/26 1207) Weight:  [161 kg] 113 kg (06/26 0755)  Recent Labs  Lab 01/03/23 0325  GLUCAP 148*   Recent Labs  Lab 01/03/23 0331 01/03/23 0332 01/04/23 0048 01/05/23 0334 01/06/23 0352  NA 137 140 137 138 138  K 3.9 3.9 3.8 3.9 4.1  CL 105 110 107 108 105  CO2 18*  --  21* 23 24  GLUCOSE 158* 152* 110* 117* 105*  BUN 18 20 16 18 17   CREATININE 1.35* 1.20* 1.14* 1.00 1.17*  CALCIUM 8.9  --  8.7* 8.9 8.8*   Recent Labs  Lab 01/03/23 0331  AST 23  ALT 30  ALKPHOS 82  BILITOT 0.5  PROT 7.1  ALBUMIN 3.4*   Recent Labs  Lab 01/03/23 0331 01/03/23 0332 01/04/23 0048 01/06/23 0352  WBC 6.6  --  5.7 6.2  NEUTROABS 2.1  --   --   --   HGB 12.5 14.6 12.0 11.6*  HCT 39.8 43.0 38.7 37.3  MCV 80.9  --  82.2 79.9*  PLT 307  --  298 298   No results for input(s): "CKTOTAL", "CKMB", "CKMBINDEX", "TROPONINI" in the last 168 hours. No results for input(s): "LABPROT", "INR" in the last 72 hours.  No results for input(s): "COLORURINE", "LABSPEC", "PHURINE", "GLUCOSEU", "HGBUR", "BILIRUBINUR", "KETONESUR", "PROTEINUR", "UROBILINOGEN", "NITRITE", "LEUKOCYTESUR" in the last 72 hours.  Invalid input(s): "APPERANCEUR"     Component Value Date/Time   CHOL 241 (H) 01/03/2023 1038   TRIG 153 (H) 01/03/2023 1038   HDL 39 (L) 01/03/2023 1038   CHOLHDL 6.2 01/03/2023 1038   VLDL 31 01/03/2023 1038   LDLCALC 171 (H) 01/03/2023 1038   Lab Results  Component Value Date   HGBA1C 8.0 (H)  01/03/2023   No results found for: "LABOPIA", "COCAINSCRNUR", "LABBENZ", "AMPHETMU", "THCU", "LABBARB"  Recent Labs  Lab 01/03/23 0331  ETH <10    I have personally reviewed the radiological images below and agree with the radiology interpretations.  ECHO TEE  Result Date: 01/06/2023    TRANSESOPHOGEAL ECHO REPORT   Patient Name:   RIKO LUMSDEN Date of Exam: 01/06/2023 Medical Rec #:  [id number]          Height:       66.0 in Accession #:    [id number]         Weight:       249.1 lb Date of Birth:  [birth year]          BSA:          2.195 m Patient Age:    52 years           BP:           109/66 mmHg Patient Gender: F                  HR:           96 bpm. Exam Location:  Inpatient Procedure: 3D Echo, Transesophageal Echo, Cardiac Doppler and Color  Doppler Indications:     Stroke  History:         Patient has prior history of Echocardiogram examinations, most                  recent 01/04/2023. Stroke; Risk Factors:Hypertension,                  Dyslipidemia, Diabetes and Current Smoker.  Sonographer:     Sheralyn Boatman RDCS Referring Phys:  [id number] Jonita Albee Diagnosing Phys: [place] PROCEDURE: After discussion of the risks and benefits of a TEE, an informed consent was obtained from the patient. The transesophogeal probe was passed without difficulty through the esophogus of the patient. Imaged were obtained with the patient in a left lateral decubitus position. Sedation performed by different physician. The patient was monitored while under deep sedation. Anesthestetic sedation was provided intravenously by Anesthesiology: 398mg  of Propofol, 20mg  of Lidocaine. The patient's vital signs; including heart rate, blood pressure, and oxygen saturation; remained stable throughout the procedure. The patient developed no complications during the procedure.  IMPRESSIONS  1. Left ventricular ejection fraction, by estimation, is 20 to 25%. The left ventricle has severely decreased function. The left  ventricle demonstrates global hypokinesis. The left ventricular internal cavity size was severely dilated.  2. Right ventricular systolic function is normal. The right ventricular size is normal.  3. No left atrial/left atrial appendage thrombus was detected.  4. Functional MR. The mitral valve is abnormal. Severe mitral valve regurgitation. No evidence of mitral stenosis.  5. The aortic valve is tricuspid. Aortic valve regurgitation is not visualized. No aortic stenosis is present.  6. The left upper pulmonary vein and right upper pulmonary vein are abnormal.  7. Agitated saline contrast bubble study was negative, with no evidence of any interatrial shunt. FINDINGS  Left Ventricle: Left ventricular ejection fraction, by estimation, is 20 to 25%. The left ventricle has severely decreased function. The left ventricle demonstrates global hypokinesis. The left ventricular internal cavity size was severely dilated. Right Ventricle: The right ventricular size is normal. No increase in right ventricular wall thickness. Right ventricular systolic function is normal. Left Atrium: No left atrial/left atrial appendage thrombus was detected. Pericardium: There is no evidence of pericardial effusion. Mitral Valve: Functional MR. The mitral valve is abnormal. Severe mitral valve regurgitation, with posteriorly-directed jet. No evidence of mitral valve stenosis. Tricuspid Valve: The tricuspid valve is normal in structure. Tricuspid valve regurgitation is trivial. Aortic Valve: The aortic valve is tricuspid. Aortic valve regurgitation is not visualized. No aortic stenosis is present. Pulmonic Valve: The pulmonic valve was normal in structure. Pulmonic valve regurgitation is not visualized. No evidence of pulmonic stenosis. Aorta: The aortic root and ascending aorta are structurally normal, with no evidence of dilitation. Venous: The left upper pulmonary vein and right upper pulmonary vein are abnormal. IAS/Shunts: No atrial level  shunt detected by color flow Doppler. Agitated saline contrast bubble study was negative, with no evidence of any interatrial shunt. Additional Comments: Spectral Doppler performed. MR Peak grad:    84.6 mmHg MR Mean grad:    47.0 mmHg MR Vmax:         460.00 cm/s MR Vmean:        308.0 cm/s MR PISA:         3.08 cm MR PISA Eff ROA: 21 mm MR PISA Radius:  0.70 cm Carolan Clines Electronically signed by Carolan Clines Signature Date/Time: 01/06/2023/11:24:09 AM    Final (Updated)  EP STUDY  Result Date: 01/06/2023 See surgical note for result.  MR CARDIAC MORPHOLOGY W WO CONTRAST  Result Date: 01/05/2023 CLINICAL DATA:  Clinical question of LVNC, Heart failure, Mitral regurgitation Study assumes HCT of 38.7  And BSA of 2.30 m2. EXAM: CARDIAC MRI TECHNIQUE: The patient was scanned on a 1.5 Tesla GE magnet. A dedicated cardiac coil was used. Functional imaging was done using Fiesta sequences. 2,3, and 4 chamber views were done to assess for RWMA's. Modified Simpson's rule using a short axis stack was used to calculate an ejection fraction on a dedicated work [employer]. The patient received 10 cc of Gadavist. After 10 minutes inversion recovery sequences were used to assess for infiltration and scar tissue. Flow quantification was performed 2 times during this examination with flow quantification performed at the levels of the ascending aorta above the valve, pulmonary artery above the valve. CONTRAST:  10 cc  of Gadavist FINDINGS: 1. Severe dilation of left ventricular size, with LVEDD 80 mm, and LVEDVi 174 mL/m2. Normal left ventricular thickness, with intraventricular septal thickness of 6 mm, posterior wall thickness of 7 mm, and myocardial mass index of 75 g/m2. Evidence of LVNC by Antony Contras and Misty Stanley criteria (2.75 ratio) and Vonita Moss Criteria (2.49 three chamber view). Indexed myocardial mass is 14%. Percent of trabeculated mass is 16%. The Grothoff criteria for LVNC are not met. Severe  decrease in left ventricular systolic function (LVEF = 19%). Global hypokinesis worse in the septum and apical inferior. No LV thrombus visualized. Left ventricular parametric mapping notable for normal T2 mapping, but with suboptimal acquisition. Increase in ECV signal 42% in basal septal. There is late gadolinium enhancement in the left ventricular myocardium: Transmural LGE in the apical inferior with associated myocardial thinning. 2. Normal right ventricular size with RVEDVI 72 mL/m2. Normal right ventricular thickness. Mild to moderate decrease in right ventricular systolic function (RVEF =40%). There is septal hypokinesis. 3.  Normal right atrial size.  Mild left atrial dilation. 4. Normal size of the aortic root, ascending aorta and pulmonary artery. 5. Valve assessment: Aortic Valve: Likely tri-leaflet.  No significant regurgitation. Pulmonic Valve: No significant regurgitation. Tricuspid Valve: Unclear morphology.  Mild regurgitation. Mitral Valve: No mitral stenosis. Moderate to severe ventricular function mitral regurgitation. Regurgitant volume 29 mL. Regurgitant fraction 37%. 6.  Normal pericardium.  No pericardial effusion. 7. Grossly, no extracardiac findings. Recommended dedicated study if concerned for non-cardiac pathology. 8. Breath hold artifact noted. This decreased the sensitivity of volumetric assessments. IMPRESSION: LVEF 19% with ventricular functional mitral regurgitation and severe dilation. 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 Riley Lam MD Electronically Signed   By: Riley Lam M.D.   On: 01/05/2023 16:10   MR CARDIAC VELOCITY FLOW MAP  Result Date: 01/05/2023 CLINICAL DATA:  Clinical question of LVNC, Heart failure, Mitral regurgitation Study assumes HCT of 38.7  And BSA of 2.30 m2. EXAM: CARDIAC MRI TECHNIQUE: The patient was scanned on a 1.5 Tesla GE magnet. A dedicated cardiac coil was used. Functional imaging was done  using Fiesta sequences. 2,3, and 4 chamber views were done to assess for RWMA's. Modified Simpson's rule using a short axis stack was used to calculate an ejection fraction on a dedicated work [employer], [employer]. The patient received 10 cc of Gadavist. After 10 minutes inversion recovery sequences were used to assess for infiltration and scar tissue. Flow quantification was performed 2 times during this examination with flow quantification performed at the levels  of the ascending aorta above the valve, pulmonary artery above the valve. CONTRAST:  10 cc  of Gadavist FINDINGS: 1. Severe dilation of left ventricular size, with LVEDD 80 mm, and LVEDVi 174 mL/m2. Normal left ventricular thickness, with intraventricular septal thickness of 6 mm, posterior wall thickness of 7 mm, and myocardial mass index of 75 g/m2. Evidence of LVNC by Antony Contras and Misty Stanley criteria (2.75 ratio) and Vonita Moss Criteria (2.49 three chamber view). Indexed myocardial mass is 14%. Percent of trabeculated mass is 16%. The Grothoff criteria for LVNC are not met. Severe decrease in left ventricular systolic function (LVEF = 19%). Global hypokinesis worse in the septum and apical inferior. No LV thrombus visualized. Left ventricular parametric mapping notable for normal T2 mapping, but with suboptimal acquisition. Increase in ECV signal 42% in basal septal. There is late gadolinium enhancement in the left ventricular myocardium: Transmural LGE in the apical inferior with associated myocardial thinning. 2. Normal right ventricular size with RVEDVI 72 mL/m2. Normal right ventricular thickness. Mild to moderate decrease in right ventricular systolic function (RVEF =40%). There is septal hypokinesis. 3.  Normal right atrial size.  Mild left atrial dilation. 4. Normal size of the aortic root, ascending aorta and pulmonary artery. 5. Valve assessment: Aortic Valve: Likely tri-leaflet.  No significant regurgitation. Pulmonic Valve: No significant  regurgitation. Tricuspid Valve: Unclear morphology.  Mild regurgitation. Mitral Valve: No mitral stenosis. Moderate to severe ventricular function mitral regurgitation. Regurgitant volume 29 mL. Regurgitant fraction 37%. 6.  Normal pericardium.  No pericardial effusion. 7. Grossly, no extracardiac findings. Recommended dedicated study if concerned for non-cardiac pathology. 8. Breath hold artifact noted. This decreased the sensitivity of volumetric assessments. IMPRESSION: LVEF 19% with ventricular functional mitral regurgitation and severe dilation. 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 Riley Lam MD Electronically Signed   By: Riley Lam M.D.   On: 01/05/2023 16:10   MR CARDIAC VELOCITY FLOW MAP  Result Date: 01/05/2023 CLINICAL DATA:  Clinical question of LVNC, Heart failure, Mitral regurgitation Study assumes HCT of 38.7  And BSA of 2.30 m2. EXAM: CARDIAC MRI TECHNIQUE: The patient was scanned on a 1.5 Tesla GE magnet. A dedicated cardiac coil was used. Functional imaging was done using Fiesta sequences. 2,3, and 4 chamber views were done to assess for RWMA's. Modified Simpson's rule using a short axis stack was used to calculate an ejection fraction on a dedicated work [employer] officer, [employer]. The patient received 10 cc of Gadavist. After 10 minutes inversion recovery sequences were used to assess for infiltration and scar tissue. Flow quantification was performed 2 times during this examination with flow quantification performed at the levels of the ascending aorta above the valve, pulmonary artery above the valve. CONTRAST:  10 cc  of Gadavist FINDINGS: 1. Severe dilation of left ventricular size, with LVEDD 80 mm, and LVEDVi 174 mL/m2. Normal left ventricular thickness, with intraventricular septal thickness of 6 mm, posterior wall thickness of 7 mm, and myocardial mass index of 75 g/m2. Evidence of LVNC by Antony Contras and Misty Stanley criteria (2.75  ratio) and Vonita Moss Criteria (2.49 three chamber view). Indexed myocardial mass is 14%. Percent of trabeculated mass is 16%. The Grothoff criteria for LVNC are not met. Severe decrease in left ventricular systolic function (LVEF = 19%). Global hypokinesis worse in the septum and apical inferior. No LV thrombus visualized. Left ventricular parametric mapping notable for normal T2 mapping, but with suboptimal acquisition. Increase in ECV signal 42% in basal septal. There is late gadolinium  enhancement in the left ventricular myocardium: Transmural LGE in the apical inferior with associated myocardial thinning. 2. Normal right ventricular size with RVEDVI 72 mL/m2. Normal right ventricular thickness. Mild to moderate decrease in right ventricular systolic function (RVEF =40%). There is septal hypokinesis. 3.  Normal right atrial size.  Mild left atrial dilation. 4. Normal size of the aortic root, ascending aorta and pulmonary artery. 5. Valve assessment: Aortic Valve: Likely tri-leaflet.  No significant regurgitation. Pulmonic Valve: No significant regurgitation. Tricuspid Valve: Unclear morphology.  Mild regurgitation. Mitral Valve: No mitral stenosis. Moderate to severe ventricular function mitral regurgitation. Regurgitant volume 29 mL. Regurgitant fraction 37%. 6.  Normal pericardium.  No pericardial effusion. 7. Grossly, no extracardiac findings. Recommended dedicated study if concerned for non-cardiac pathology. 8. Breath hold artifact noted. This decreased the sensitivity of volumetric assessments. IMPRESSION: LVEF 19% with ventricular functional mitral regurgitation and severe dilation. 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 Riley Lam MD Electronically Signed   By: Riley Lam M.D.   On: 01/05/2023 16:10   ECHOCARDIOGRAM COMPLETE BUBBLE STUDY  Result Date: 01/04/2023    ECHOCARDIOGRAM REPORT   Patient Name:   DAJSHA MASSARO Date of Exam:  01/04/2023 Medical Rec #:  [id number]          Height:       66.0 in Accession #:    [id number]         Weight:       250.0 lb Date of Birth:  January 21, 1971          BSA:          2.199 m Patient Age:    52 years           BP:           142/89 mmHg Patient Gender: F                  HR:           93 bpm. Exam Location:  Inpatient Procedure: 2D Echo, Cardiac Doppler, Color Doppler, Saline Contrast Bubble Study            and Intracardiac Opacification Agent Indications:    stroke  History:        Patient has prior history of Echocardiogram examinations, most                 recent 08/06/2020. Stroke; Risk Factors:Current Smoker,                 Hypertension, Diabetes and Dyslipidemia.  Sonographer:    Delcie Roch RDCS Referring Phys: [id number] [facility] IMPRESSIONS  1. Left ventricular ejection fraction, by estimation, is 20 to 25%. The left ventricle has severely decreased function. The left ventricle demonstrates global hypokinesis. The left ventricular internal cavity size was severely dilated. Left ventricular diastolic parameters are indeterminate.  2. Right ventricular systolic function is normal. The right ventricular size is normal. Tricuspid regurgitation signal is inadequate for assessing PA pressure.  3. Left atrial size was mildly dilated.  4. The mitral valve is abnormal. Moderate to severe mitral valve regurgitation. No evidence of mitral stenosis.  5. The aortic valve is tricuspid. Aortic valve regurgitation is not visualized. No aortic stenosis is present.  6. The inferior vena cava is normal in size with greater than 50% respiratory variability, suggesting right atrial pressure of 3 mmHg.  7. Agitated saline contrast bubble study was negative, with no evidence of any interatrial shunt. Comparison(s): Prior  images unable to be directly viewed, comparison made by report only. Changes from prior study are noted. Prior echo 08/06/20 at [facility] with reported EF 40-45%, worse on current study.  Conclusion(s)/Recommendation(s): Severely dilated LV, with severely reduced LVEF. Very eccentric MR, likely moderate-severe. Findings communicated to primary team. FINDINGS  Left Ventricle: Left ventricular ejection fraction, by estimation, is 20 to 25%. The left ventricle has severely decreased function. The left ventricle demonstrates global hypokinesis. Definity contrast agent was given IV to delineate the left ventricular endocardial borders. The left ventricular internal cavity size was severely dilated. There is no left ventricular hypertrophy. Left ventricular diastolic parameters are indeterminate. Right Ventricle: The right ventricular size is normal. No increase in right ventricular wall thickness. Right ventricular systolic function is normal. Tricuspid regurgitation signal is inadequate for assessing PA pressure. Left Atrium: Left atrial size was mildly dilated. Right Atrium: Right atrial size was normal in size. Pericardium: There is no evidence of pericardial effusion. Mitral Valve: Highly eccentric MR jet, moderate-severe. The mitral valve is abnormal. There is mild thickening of the mitral valve leaflet(s). There is mild calcification of the mitral valve leaflet(s). Moderate to severe mitral valve regurgitation. No evidence of mitral valve stenosis. Tricuspid Valve: The tricuspid valve is grossly normal. Tricuspid valve regurgitation is trivial. No evidence of tricuspid stenosis. Aortic Valve: The aortic valve is tricuspid. Aortic valve regurgitation is not visualized. No aortic stenosis is present. Pulmonic Valve: The pulmonic valve was not well visualized. Pulmonic valve regurgitation is trivial. No evidence of pulmonic stenosis. Aorta: The aortic root, ascending aorta and aortic arch are all structurally normal, with no evidence of dilitation or obstruction. Venous: The inferior vena cava is normal in size with greater than 50% respiratory variability, suggesting right atrial pressure of 3 mmHg.  IAS/Shunts: The atrial septum is grossly normal. Agitated saline contrast was given intravenously to evaluate for intracardiac shunting. Agitated saline contrast bubble study was negative, with no evidence of any interatrial shunt.  LEFT VENTRICLE PLAX 2D LVIDd:         7.10 cm      Diastology LVIDs:         6.36 cm      LV e' medial:    7.18 cm/s LV PW:         1.00 cm      LV E/e' medial:  14.3 LV IVS:        0.90 cm      LV e' lateral:   6.09 cm/s LVOT diam:     2.20 cm      LV E/e' lateral: 16.9 LV SV:         44 LV SV Index:   20 LVOT Area:     3.80 cm  LV Volumes (MOD) LV vol d, MOD A2C: 246.0 ml LV vol s, MOD A2C: 174.0 ml LV SV MOD A2C:     72.0 ml RIGHT VENTRICLE             IVC RV Basal diam:  2.10 cm     IVC diam: 2.00 cm RV S prime:     10.20 cm/s TAPSE (M-mode): 1.9 cm LEFT ATRIUM             Index        RIGHT ATRIUM          Index LA diam:        3.80 cm 1.73 cm/m   RA Area:     9.43 cm LA Vol (A2C):  65.7 ml 29.88 ml/m  RA Volume:   17.70 ml 8.05 ml/m LA Vol (A4C):   64.1 ml 29.15 ml/m LA Biplane Vol: 68.4 ml 31.11 ml/m  AORTIC VALVE LVOT Vmax:   75.40 cm/s LVOT Vmean:  44.400 cm/s LVOT VTI:    0.116 m  AORTA Ao Root diam: 2.70 cm Ao Asc diam:  3.00 cm MR Peak grad: 124.5 mmHg MR Mean grad: 77.0 mmHg     SHUNTS MR Vmax:      558.00 cm/s   Systemic VTI:  0.12 m MR Vmean:     410.0 cm/s    Systemic Diam: 2.20 cm MV E velocity: 103.00 cm/s MV A velocity: 136.00 cm/s MV E/A ratio:  0.76 Jodelle Red MD Electronically signed by Jodelle Red MD Signature Date/Time: 01/04/2023/3:40:08 PM    Final    CT ANGIO HEAD NECK W WO CM  Result Date: 01/03/2023 CLINICAL DATA:  Stroke suspected, determine embolic source EXAM: CT ANGIOGRAPHY HEAD AND NECK WITH AND WITHOUT CONTRAST TECHNIQUE: Multidetector CT imaging of the head and neck was performed using the standard protocol during bolus administration of intravenous contrast. Multiplanar CT image reconstructions and MIPs were obtained to  evaluate the vascular anatomy. Carotid stenosis measurements (when applicable) are obtained utilizing NASCET criteria, using the distal internal carotid diameter as the denominator. RADIATION DOSE REDUCTION: This exam was performed according to the departmental dose-optimization program which includes automated exposure control, adjustment of the mA and/or kV according to patient size and/or use of iterative reconstruction technique. CONTRAST:  75mL OMNIPAQUE IOHEXOL 350 MG/ML SOLN COMPARISON:  Brain MRI from earlier today FINDINGS: CTA NECK FINDINGS Aortic arch: Normal with 3 vessel branching. Right carotid system: Vessels are smoothly contoured and widely patent without atherosclerosis. Left carotid system: Vessels are smoothly contoured and widely patent without atherosclerosis. Vertebral arteries: Vessels are smoothly contoured and widely patent without atherosclerosis. Strong right vertebral artery dominance. Skeleton: Ordinary cervical spine degeneration. Other neck: Unremarkable Upper chest: Clear apical lungs Review of the MIP images confirms the above findings CTA HEAD FINDINGS Anterior circulation: No significant stenosis, proximal occlusion, aneurysm, or vascular malformation. Posterior circulation: Strong right vertebral artery dominance. Vessels are smoothly contoured and diffusely patent. No branch occlusion or aneurysm Venous sinuses: Normal Anatomic variants: None significant Review of the MIP images confirms the above findings IMPRESSION: Normal CTA of the head and neck. Electronically Signed   By: Tiburcio Pea M.D.   On: 01/03/2023 09:54   MR BRAIN W CONTRAST  Result Date: 01/03/2023 CLINICAL DATA:  Headache.  Abnormal left temporal lobe. EXAM: MRI HEAD WITH CONTRAST TECHNIQUE: Multiplanar, multiecho pulse sequences of the brain and surrounding structures were obtained with intravenous contrast. CONTRAST:  10mL GADAVIST GADOBUTROL 1 MMOL/ML IV SOLN COMPARISON:  Noncontrast brain MRI from  earlier today FINDINGS: The anterior and lateral left temporal cortex abnormality is T1 hyperintense by prior brain MRI and shows cortically based enhancement. On T2 weighted imaging there is suggestion of early volume loss and subacute infarct is favored. There is somewhat unusual extension into the subinsular white matter and follow-up is recommended in this young patient to exclude mass or inflammatory process. No other areas of abnormal enhancement. IMPRESSION: T1 hyperintense and enhancing cortex at the left temporal lobe abnormality, favor subacute infarct. Follow-up with contrast in approximately 2 months is recommended to confirm resolution. Electronically Signed   By: Tiburcio Pea M.D.   On: 01/03/2023 06:58   MR BRAIN WO CONTRAST  Result Date: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

## 2023-01-06 NOTE — Anesthesia Preprocedure Evaluation (Signed)
Anesthesia Evaluation  Patient identified by MRN, date of birth, ID band Patient awake    Reviewed: Allergy & Precautions, NPO status , Patient's Chart, lab work & pertinent test results  History of Anesthesia Complications Negative for: history of anesthetic complications  Airway Mallampati: III  TM Distance: >3 FB Neck ROM: Full    Dental  (+) Dental Advisory Given, Teeth Intact,    Pulmonary neg shortness of breath, neg sleep apnea, neg COPD, neg recent URI, Patient abstained from smoking., former smoker   breath sounds clear to auscultation       Cardiovascular hypertension, Pt. on medications and Pt. on home beta blockers (-) angina (-) Past MI and (-) CHF  Rhythm:Regular     Neuro/Psych CVA, No Residual Symptoms  negative psych ROS   GI/Hepatic negative GI ROS, Neg liver ROS,,,  Endo/Other  diabetes  Morbid obesity  Renal/GU negative Renal ROS     Musculoskeletal negative musculoskeletal ROS (+)    Abdominal   Peds  Hematology negative hematology ROS (+)   Anesthesia Other Findings   Reproductive/Obstetrics                             Anesthesia Physical Anesthesia Plan  ASA: 2  Anesthesia Plan: MAC   Post-op Pain Management: Minimal or no pain anticipated   Induction: Intravenous  PONV Risk Score and Plan: 2 and Propofol infusion and Treatment may vary due to age or medical condition  Airway Management Planned: Nasal Cannula, Natural Airway and Simple Face Mask  Additional Equipment: None  Intra-op Plan:   Post-operative Plan:   Informed Consent: I have reviewed the patients History and Physical, chart, labs and discussed the procedure including the risks, benefits and alternatives for the proposed anesthesia with the patient or authorized representative who has indicated his/her understanding and acceptance.     Dental advisory given  Plan Discussed with:  CRNA  Anesthesia Plan Comments:        Anesthesia Quick Evaluation

## 2023-01-06 NOTE — Progress Notes (Signed)
Progress Note  Patient Name: Melody Robles Date of Encounter: 01/06/2023 Primary Cardiologist: None   Subjective   CMR suggestive of significant LV dysfunction and dilation with some criteria positive for left ventricular non-compaction. And significant mitral regurgitation. Patient notes that she is feeling better; she is eager to go home today; but only if its the right thing for her.  Vital Signs    Vitals:   01/05/23 1723 01/05/23 1950 01/05/23 2312 01/06/23 0329  BP: 104/62 110/81 111/71 109/66  Pulse: (!) 48 92 89 81  Resp: 18 18 17 16   Temp: 98 F (36.7 C) 98 F (36.7 C) 98.2 F (36.8 C) 97.7 F (36.5 C)  TempSrc: Oral Oral Oral Oral  SpO2: 100% 100% 100% 99%  Weight:      Height:        Intake/Output Summary (Last 24 hours) at 01/06/2023 0705 Last data filed at 01/05/2023 1000 Gross per 24 hour  Intake 480 ml  Output --  Net 480 ml   Filed Weights   01/03/23 0328  Weight: 113.4 kg    Physical Exam   GEN: No acute distress.  Morbid obesity Neck: No JVD Cardiac: RRR, systolic murmur no rubs, or gallops.  Respiratory: Clear to auscultation bilaterally. GI: Soft, nontender, non-distended  MS: No edema  Labs   Telemetry: Off telemetry; SR with PVCs in endo-suite   Chemistry Recent Labs  Lab 01/03/23 0331 01/03/23 0332 01/04/23 0048 01/05/23 0334 01/06/23 0352  NA 137   < > 137 138 138  K 3.9   < > 3.8 3.9 4.1  CL 105   < > 107 108 105  CO2 18*  --  21* 23 24  GLUCOSE 158*   < > 110* 117* 105*  BUN 18   < > 16 18 17   CREATININE 1.35*   < > 1.14* 1.00 1.17*  CALCIUM 8.9  --  8.7* 8.9 8.8*  PROT 7.1  --   --   --   --   ALBUMIN 3.4*  --   --   --   --   AST 23  --   --   --   --   ALT 30  --   --   --   --   ALKPHOS 82  --   --   --   --   BILITOT 0.5  --   --   --   --   GFRNONAA 48*  --  59* >60 57*  ANIONGAP 14  --  9 7 9    < > = values in this interval not displayed.     Hematology Recent Labs  Lab 01/03/23 0331  01/03/23 0332 01/04/23 0048 01/06/23 0352  WBC 6.6  --  5.7 6.2  RBC 4.92  --  4.71 4.67  HGB 12.5 14.6 12.0 11.6*  HCT 39.8 43.0 38.7 37.3  MCV 80.9  --  82.2 79.9*  MCH 25.4*  --  25.5* 24.8*  MCHC 31.4  --  31.0 31.1  RDW 14.8  --  14.4 14.5  PLT 307  --  298 298    Cardiac EnzymesNo results for input(s): "TROPONINI" in the last 168 hours. No results for input(s): "TROPIPOC" in the last 168 hours.   BNPNo results for input(s): "BNP", "PROBNP" in the last 168 hours.   DDimer No results for input(s): "DDIMER" in the last 168 hours.   Cardiac Studies   Cardiac Studies & Procedures  ECHOCARDIOGRAM  ECHOCARDIOGRAM COMPLETE BUBBLE STUDY 01/04/2023  Narrative ECHOCARDIOGRAM REPORT    Patient Name:   Melody Robles Date of Exam: 01/04/2023 Medical Rec #:  [id number]          Height:       66.0 in Accession #:    [id number]         Weight:       250.0 lb Date of Birth:  1971/05/25          BSA:          2.199 m Patient Age:    51 years           BP:           142/89 mmHg Patient Gender: F                  HR:           93 bpm. Exam Location:  Inpatient  Procedure: 2D Echo, Cardiac Doppler, Color Doppler, Saline Contrast Bubble Study and Intracardiac Opacification Agent  Indications:    stroke  History:        Patient has prior history of Echocardiogram examinations, most recent 08/06/2020. Stroke; Risk Factors:Current Smoker, Hypertension, Diabetes and Dyslipidemia.  Sonographer:    Delcie Roch RDCS Referring Phys: [id number] [place]  IMPRESSIONS   1. Left ventricular ejection fraction, by estimation, is 20 to 25%. The left ventricle has severely decreased function. The left ventricle demonstrates global hypokinesis. The left ventricular internal cavity size was severely dilated. Left ventricular diastolic parameters are indeterminate. 2. Right ventricular systolic function is normal. The right ventricular size is normal. Tricuspid regurgitation  signal is inadequate for assessing PA pressure. 3. Left atrial size was mildly dilated. 4. The mitral valve is abnormal. Moderate to severe mitral valve regurgitation. No evidence of mitral stenosis. 5. The aortic valve is tricuspid. Aortic valve regurgitation is not visualized. No aortic stenosis is present. 6. The inferior vena cava is normal in size with greater than 50% respiratory variability, suggesting right atrial pressure of 3 mmHg. 7. Agitated saline contrast bubble study was negative, with no evidence of any interatrial shunt.  Comparison(s): Prior images unable to be directly viewed, comparison made by report only. Changes from prior study are noted. Prior echo 08/06/20 at [facility] with reported EF 40-45%, worse on current study.  Conclusion(s)/Recommendation(s): Severely dilated LV, with severely reduced LVEF. Very eccentric MR, likely moderate-severe. Findings communicated to primary team.  FINDINGS Left Ventricle: Left ventricular ejection fraction, by estimation, is 20 to 25%. The left ventricle has severely decreased function. The left ventricle demonstrates global hypokinesis. Definity contrast agent was given IV to delineate the left ventricular endocardial borders. The left ventricular internal cavity size was severely dilated. There is no left ventricular hypertrophy. Left ventricular diastolic parameters are indeterminate.  Right Ventricle: The right ventricular size is normal. No increase in right ventricular wall thickness. Right ventricular systolic function is normal. Tricuspid regurgitation signal is inadequate for assessing PA pressure.  Left Atrium: Left atrial size was mildly dilated.  Right Atrium: Right atrial size was normal in size.  Pericardium: There is no evidence of pericardial effusion.  Mitral Valve: Highly eccentric MR jet, moderate-severe. The mitral valve is abnormal. There is mild thickening of the mitral valve leaflet(s). There is mild calcification of  the mitral valve leaflet(s). Moderate to severe mitral valve regurgitation. No evidence of mitral valve stenosis.  Tricuspid Valve: The tricuspid valve is grossly normal. Tricuspid valve regurgitation is trivial.  No evidence of tricuspid stenosis.  Aortic Valve: The aortic valve is tricuspid. Aortic valve regurgitation is not visualized. No aortic stenosis is present.  Pulmonic Valve: The pulmonic valve was not well visualized. Pulmonic valve regurgitation is trivial. No evidence of pulmonic stenosis.  Aorta: The aortic root, ascending aorta and aortic arch are all structurally normal, with no evidence of dilitation or obstruction.  Venous: The inferior vena cava is normal in size with greater than 50% respiratory variability, suggesting right atrial pressure of 3 mmHg.  IAS/Shunts: The atrial septum is grossly normal. Agitated saline contrast was given intravenously to evaluate for intracardiac shunting. Agitated saline contrast bubble study was negative, with no evidence of any interatrial shunt.   LEFT VENTRICLE PLAX 2D LVIDd:         7.10 cm      Diastology LVIDs:         6.36 cm      LV e' medial:    7.18 cm/s LV PW:         1.00 cm      LV E/e' medial:  14.3 LV IVS:        0.90 cm      LV e' lateral:   6.09 cm/s LVOT diam:     2.20 cm      LV E/e' lateral: 16.9 LV SV:         44 LV SV Index:   20 LVOT Area:     3.80 cm  LV Volumes (MOD) LV vol d, MOD A2C: 246.0 ml LV vol s, MOD A2C: 174.0 ml LV SV MOD A2C:     72.0 ml  RIGHT VENTRICLE             IVC RV Basal diam:  2.10 cm     IVC diam: 2.00 cm RV S prime:     10.20 cm/s TAPSE (M-mode): 1.9 cm  LEFT ATRIUM             Index        RIGHT ATRIUM          Index LA diam:        3.80 cm 1.73 cm/m   RA Area:     9.43 cm LA Vol (A2C):   65.7 ml 29.88 ml/m  RA Volume:   17.70 ml 8.05 ml/m LA Vol (A4C):   64.1 ml 29.15 ml/m LA Biplane Vol: 68.4 ml 31.11 ml/m AORTIC VALVE LVOT Vmax:   75.40 cm/s LVOT Vmean:  44.400  cm/s LVOT VTI:    0.116 m  AORTA Ao Root diam: 2.70 cm Ao Asc diam:  3.00 cm  MR Peak grad: 124.5 mmHg MR Mean grad: 77.0 mmHg     SHUNTS MR Vmax:      558.00 cm/s   Systemic VTI:  0.12 m MR Vmean:     410.0 cm/s    Systemic Diam: 2.20 cm MV E velocity: 103.00 cm/s MV A velocity: 136.00 cm/s MV E/A ratio:  0.76  Jodelle Red MD Electronically signed by Jodelle Red MD Signature Date/Time: 01/04/2023/3:40:08 PM    Final      CARDIAC MRI  MR CARDIAC MORPHOLOGY W WO CONTRAST 01/05/2023  Narrative CLINICAL DATA:  Clinical question of LVNC, Heart failure, Mitral regurgitation Study assumes HCT of 38.7  And BSA of 2.30 m2.  EXAM: CARDIAC MRI  TECHNIQUE: The patient was scanned on a 1.5 Tesla GE magnet. A dedicated cardiac coil was used. Functional imaging was done using Fiesta sequences.  2,3, and 4 chamber views were done to assess for RWMA's. Modified Simpson's rule using a short axis stack was used to calculate an ejection fraction on a dedicated work [employer]. The patient received 10 cc of Gadavist. After 10 minutes inversion recovery sequences were used to assess for infiltration and scar tissue. Flow quantification was performed 2 times during this examination with flow quantification performed at the levels of the ascending aorta above the valve, pulmonary artery above the valve.  CONTRAST:  10 cc  of Gadavist  FINDINGS: 1. Severe dilation of left ventricular size, with LVEDD 80 mm, and LVEDVi 174 mL/m2.  Normal left ventricular thickness, with intraventricular septal thickness of 6 mm, posterior wall thickness of 7 mm, and myocardial mass index of 75 g/m2.  Evidence of LVNC by Antony Contras and Misty Stanley criteria (2.75 ratio) and Vonita Moss Criteria (2.49 three chamber view).  Indexed myocardial mass is 14%. Percent of trabeculated mass is 16%. The Grothoff criteria for LVNC are not met.  Severe decrease in left ventricular  systolic function (LVEF = 19%). Global hypokinesis worse in the septum and apical inferior. No LV thrombus visualized.  Left ventricular parametric mapping notable for normal T2 mapping, but with suboptimal acquisition.  Increase in ECV signal 42% in basal septal.  There is late gadolinium enhancement in the left ventricular myocardium: Transmural LGE in the apical inferior with associated myocardial thinning.  2. Normal right ventricular size with RVEDVI 72 mL/m2.  Normal right ventricular thickness.  Mild to moderate decrease in right ventricular systolic function (RVEF =40%). There is septal hypokinesis.  3.  Normal right atrial size.  Mild left atrial dilation.  4. Normal size of the aortic root, ascending aorta and pulmonary artery.  5. Valve assessment:  Aortic Valve: Likely tri-leaflet.  No significant regurgitation.  Pulmonic Valve: No significant regurgitation.  Tricuspid Valve: Unclear morphology.  Mild regurgitation.  Mitral Valve: No mitral stenosis. Moderate to severe ventricular function mitral regurgitation. Regurgitant volume 29 mL. Regurgitant fraction 37%.  6.  Normal pericardium.  No pericardial effusion.  7. Grossly, no extracardiac findings. Recommended dedicated study if concerned for non-cardiac pathology.  8. Breath hold artifact noted. This decreased the sensitivity of volumetric assessments.  IMPRESSION: LVEF 19% with ventricular functional mitral regurgitation and severe dilation.  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  Riley Lam MD   Electronically Signed By: Riley Lam M.D. On: 01/05/2023 16:10         Assessment & Plan   Chronic combined systolic heart failure - with some criteria consistent with LVNC and with CVA - diuretics: euvolemic; hydrochlorothiazide held with no plans to restart inpatient - continue succinate and SGLT2i - BP is too low for ARNI (s/p  lisinopril wash out) - if BP increases after being NPO for TEE will get low dose losartan; otherwise will get 12.5 mg aldactone; planned for outpatient ARNI - Given some criteria consistent with LVNC and LVEF < 30% I would favor DOAC     CVA with prior history - no LV thrombus, brain MRI did not described embolic milieu - neurology recommended TEE; this is planned for today - NPO  - DAPT and statin as per neurology team  Frequent PVCs - BB as above - preventice monitor at discharge for PVC burden, not principally for AF evaluation  Moderate to Severe MR for horizontal color splay - TEE pending; suspect etiology is ventricular functional   She is nearing discharge; we will work on  follow up with me and my team in this regard   For questions or updates, please contact [facility] Please consult [email or web address] for contact info under Cardiology/STEMI.      Riley Lam, MD FASE [facility] Cardiologist [facility]  [address] [place], [address] [place], [place] [address] [phone]  7:05 AM

## 2023-01-06 NOTE — TOC Transition Note (Signed)
Transition of Care [facility]) - CM/SW Discharge Note   Patient Details  Name: Melody Robles MRN: [id number] Date of Birth: December 28, 1970  Transition of Care [facility] CM/SW Contact:  Kermit Balo, RN Phone Number: 01/06/2023, 1:20 PM   Clinical Narrative:    Pt is discharging home with outpatient therapy through [facility]. Information on the AVS.  Walker for home to be delivered to the room per [employer]. Pt has transportation home.    Final next level of care: OP Rehab Barriers to Discharge: No Barriers Identified   Patient Goals and CMS Choice   Choice offered to / list presented to : Patient  Discharge Placement                         Discharge Plan and Services Additional resources added to the After Visit Summary for     Discharge Planning Services: CM Consult Post Acute Care Choice: Durable Medical Equipment          DME Arranged: Dan Humphreys rolling DME Agency: [employer] Date DME Agency Contacted: 01/05/23   Representative spoke with at DME Agency: Timothy Lasso            Social Determinants of Health (SDOH) Interventions SDOH Screenings   Food Insecurity: No Food Insecurity (01/04/2023)  Housing: Low Risk  (01/05/2023)  Transportation Needs: No Transportation Needs (01/05/2023)  Utilities: Not At Risk (01/04/2023)  Alcohol Screen: Low Risk  (01/05/2023)  Financial Resource Strain: Low Risk  (01/05/2023)  Tobacco Use: Medium Risk (01/06/2023)     Readmission Risk Interventions     No data to display

## 2023-01-06 NOTE — Progress Notes (Signed)
  Echocardiogram Echocardiogram Transesophageal has been performed.  [name] 01/06/2023, 9:35 AM

## 2023-01-06 NOTE — Discharge Instructions (Addendum)
Heart Monitor:   Length of Wear: 30 days   Your monitor will be mailed to your home address within 3-5 business days. However, if you have not received your monitor after 5 business days please send [place] a MyChart message or call the office at [phone], so we may follow up on this for you.    Your physician has recommended that you wear a Prentesis monitor.    This monitor is a medical device that records the heart's electrical activity. Doctors most often use these monitors to diagnose arrhythmias. Arrhythmias are problems with the speed or rhythm of the heartbeat. The monitor is a small device applied to your chest. Though this may change depending on monitor you are sent.  You can wear one while you do your normal daily activities. While wearing this monitor if you have any symptoms to push the button and record what you felt. Once you have worn this monitor for the period of time provider prescribed (Usually 14 days), you will return the monitor device in the postage paid box. Once it is returned they will download the data collected and provide [place] with a report which the provider will then review and we will call you with those results. Important tips:   1. Avoid showering during the first 24 hours of wearing the monitor. 2. Avoid excessive sweating to help maximize wear time. 3. Do not submerge the device, no hot tubs, and no swimming pools. 4. Keep any lotions or oils away from the patch. 5. After 24 hours you may shower with the patch on. Take brief showers with your back facing the shower head.  6. Do not remove patch once it has been placed because that will interrupt data and decrease adhesive wear time. 7. Push the button when you have any symptoms and write down what you were feeling. 8. Once you have completed wearing your monitor, remove and place into box which has postage paid and place in your outgoing mailbox.  9. If for some reason you have misplaced your box then call  our office and we can provide another box and/or mail it off for you.  Low salt diet   Weigh daily and record weight best to weigh first thing in morning after going to bathroom.  Bring to appt. If your weight climbs 3 pounds in a day or 5 pounds in a week call the office of Dr. Izora Ribas.   Ms. Sheletha Bow, It was a pleasure taking care of you at [facility]. You were admitted for a stroke and heart failure. We are discharging you home now that you are doing better. Please follow the following instructions.   1) For your stroke, please start taking Eliquis 5 mg twice daily, atorvastatin 40 mg daily. Eliquis is a blood thinner, so if you notice any bleeding please let your doctor know. As you are following up with the internal medicine clinic you can call our office at [phone].  2) For your diabetes please start taking metformin 500 mg daily.   3) Regarding your heart failure, your ultrasound of your heart showed a low ejection fraction of 20-25%. We have also started medications for your heart failure which include Jardiance 10 mg daily, metoprolol succinate milligrams daily, and spironolactone 12.5 mg daily (half tablet). Please do not miss any doses.  4) You have an appointment with the internal medicine clinic which in the basement of this hospital. Your appointment is on 01/13/2023 with Dr. Sherrilee Gilles  at 3:45pm, please show up.  5) You also have an appointment with your cardiologist on 01/19/2023 please show up to this point.  We are sending you home on a loop recorder, your cardiologist will follow-up the results to evaluate for any arrhythmias in your heart.  6) If you develop any stroke like symptoms, please return back to the emergency department.   7) We have stopped your aspirin and your lisinopril.  Stop taking this medicine.  Take care,  Dr. Modena Slater, DO

## 2023-01-06 NOTE — Plan of Care (Signed)
Pt is alert and oriented x 4. Up with assist/walker/nurse or daughter. Pt ambulated approx 75 feet in hallway. Reports normal sensation on the right side and improved movement with picking right foot up. Vitals stable. Pt is NPO for TEE this am.  Problem: Education: Goal: Knowledge of General Education information will improve Description: Including pain rating scale, medication(s)/side effects and non-pharmacologic comfort measures Outcome: Progressing   Problem: Health Behavior/Discharge Planning: Goal: Ability to manage health-related needs will improve Outcome: Progressing   Problem: Clinical Measurements: Goal: Ability to maintain clinical measurements within normal limits will improve Outcome: Progressing Goal: Will remain free from infection Outcome: Progressing Goal: Diagnostic test results will improve Outcome: Progressing Goal: Respiratory complications will improve Outcome: Progressing Goal: Cardiovascular complication will be avoided Outcome: Progressing   Problem: Activity: Goal: Risk for activity intolerance will decrease Outcome: Progressing   Problem: Nutrition: Goal: Adequate nutrition will be maintained Outcome: Progressing   Problem: Coping: Goal: Level of anxiety will decrease Outcome: Progressing   Problem: Elimination: Goal: Will not experience complications related to bowel motility Outcome: Progressing Goal: Will not experience complications related to urinary retention Outcome: Progressing   Problem: Pain Managment: Goal: General experience of comfort will improve Outcome: Progressing   Problem: Safety: Goal: Ability to remain free from injury will improve Outcome: Progressing   Problem: Skin Integrity: Goal: Risk for impaired skin integrity will decrease Outcome: Progressing

## 2023-01-06 NOTE — Transfer of Care (Signed)
Immediate Anesthesia Transfer of Care Note  Patient: Melody Robles  Procedure(s) Performed: TRANSESOPHAGEAL ECHOCARDIOGRAM  Patient Location: PACU and Cath Lab  Anesthesia Type:MAC  Level of Consciousness: drowsy and responds to stimulation  Airway & Oxygen Therapy: Patient Spontanous Breathing and Patient connected to nasal cannula oxygen  Post-op Assessment: Report given to RN and Post -op Vital signs reviewed and stable  Post vital signs: Reviewed and stable  Last Vitals:  Vitals Value Taken Time  BP 116/81 01/06/23 0912  Temp    Pulse 90 01/06/23 0914  Resp 15 01/06/23 0914  SpO2 98 % 01/06/23 0914  Vitals shown include unvalidated device data.  Last Pain:  Vitals:   01/06/23 0755  TempSrc: Temporal  PainSc:          Complications: No notable events documented.

## 2023-01-06 NOTE — Progress Notes (Signed)
   Heart Failure Stewardship Pharmacist Progress Note   PCP: [facility] PCP-Cardiologist: Christell Constant, MD    HPI:  52 yo F with PMH of CHF, CVA, HTN, pre-diabetes, tobacco use, obesity, and HLD.  Admitted with CVA in March 2021. EF at that time was 30-35%. No shunt on bubble study. No afib on Zio. Repeat ECHO 11/2019 was 30-35%. Cath ordered and showed normal coronary arteries. Last repeat ECHO 07/2020 showed EF 40-45%.   Presented to the ED on 6/23 as code stroke with reported slurred speech and R sided weakness. CT without acute intracranial abnormality. MRI concerning for left temporal subacute infarct.  ECHO showed LVEF 20-25%, global hypokinesis, RV normal, moderate to severe MR. cMRI suggestive of significant LV dysfunction and dilation with some positive criteria for LV non compaction and severe MR. TEE with severely dilated LV with severe LV dysfunction, severe MR, negative bubble study, no LV/LAA thrombus.   Current HF Medications: Beta Blocker: metoprolol XL 50 mg daily MRA: spironolactone 12.5 mg daily SGLT2i: Jardiance 10 mg daily  Prior to admission HF Medications: Beta blocker: metoprolol XL 25 mg daily ACE/ARB/ARNI: lisinopril 20 mg daily  Pertinent Lab Values: Serum creatinine 1.17, BUN 17, Potassium 4.1, Sodium 138, A1c 8.0   Vital Signs: Weight: 249 lbs Blood pressure: 120-130/70-90s  Heart rate: 80-90s  I/O: incomplete  Medication Assistance / Insurance Benefits Check: Does the patient have prescription insurance?  Yes Type of insurance plan:  Medicaid  Outpatient Pharmacy:  Prior to admission outpatient pharmacy: [facility] Is the patient willing to use [facility] TOC pharmacy at discharge? Yes Is the patient willing to transition their outpatient pharmacy to utilize a [facility] outpatient pharmacy?   Pending    Assessment: 1. Acute on chronic systolic CHF (LVEF 20-25%), pending etiology work up. NYHA class II  symptoms. - Not volume overloaded on exam, no indication for diuretics. - Continue metoprolol XL 50 mg daily - Agree with starting spironolactone 12.5 mg daily today - May be able to start ARB/ARNI tomorrow if BP tolerates. Last dose of lisinopril 6/24. - Continue Jardiance 10 mg daily   Plan: 1) Medication changes recommended at this time: - Agree with changes  2) Patient assistance: - Jardiance copay $0 - Entresto copay $0  3)  Education  - Patient has been educated on current HF medications and potential additions to HF medication regimen - Patient verbalizes understanding that over the next few months, these medication doses may change and more medications may be added to optimize HF regimen - Patient has been educated on basic disease state pathophysiology and goals of therapy   Sharen Hones, PharmD, BCPS Heart Failure Stewardship Pharmacist Phone [phone]

## 2023-01-06 NOTE — Interval H&P Note (Signed)
History and Physical Interval Note:  01/06/2023 8:16 AM  Melody Robles  has presented today for surgery, with the diagnosis of cva.  The various methods of treatment have been discussed with the patient and family. After consideration of risks, benefits and other options for treatment, the patient has consented to  Procedure(s): TRANSESOPHAGEAL ECHOCARDIOGRAM (N/A) as a surgical intervention.  The patient's history has been reviewed, patient examined, no change in status, stable for surgery.  I have reviewed the patient's chart and labs.  Questions were answered to the patient's satisfaction.     Maisie Fus

## 2023-01-06 NOTE — Discharge Summary (Signed)
Name: Melody Robles MRN: [id number] DOB: 05-26-1971 52 y.o. PCP: [facility]  Date of Admission: 01/03/2023  3:25 AM Date of Discharge: 01/06/2023 Attending Physician: Dr.  Mercie Eon  Discharge Diagnosis: Principal Problem:   Acute CVA (cerebrovascular accident) [facility]) Active Problems:   HTN (hypertension)   HLD (hyperlipidemia)   Type 2 diabetes mellitus (HCC)   Right sided weakness   HFrEF (heart failure with reduced ejection fraction) (HCC)    Discharge Medications: Allergies as of 01/06/2023       Reactions   Benadryl [diphenhydramine] Itching, Rash   Morphine Itching, Rash        Medication List     STOP taking these medications    aspirin EC 81 MG tablet   hydrochlorothiazide 12.5 MG tablet Commonly known as: HYDRODIURIL   lidocaine 5 % Commonly known as: Lidoderm   lisinopril 20 MG tablet Commonly known as: ZESTRIL       TAKE these medications    apixaban 5 MG Tabs tablet Commonly known as: ELIQUIS Take 1 tablet (5 mg total) by mouth 2 (two) times daily.   atorvastatin 40 MG tablet Commonly known as: LIPITOR Take 1 tablet (40 mg total) by mouth daily. Start taking on: January 07, 2023 What changed:  medication strength how much to take when to take this   empagliflozin 10 MG Tabs tablet Commonly known as: JARDIANCE Take 1 tablet (10 mg total) by mouth daily. Start taking on: January 07, 2023   metFORMIN 500 MG tablet Commonly known as: GLUCOPHAGE Take 1 tablet (500 mg total) by mouth daily with breakfast. What changed:  how much to take when to take this   metoprolol succinate 50 MG 24 hr tablet Commonly known as: TOPROL-XL Take 1 tablet (50 mg total) by mouth daily. Take with or immediately following a meal. Start taking on: January 07, 2023 What changed:  medication strength how much to take additional instructions   spironolactone 25 MG tablet Commonly known as: ALDACTONE Take 0.5 tablets (12.5  mg total) by mouth daily. Start taking on: January 07, 2023               Durable Medical Equipment  (From admission, onward)           Start     Ordered   01/05/23 1133  For home use only DME Walker rolling  Once       Question Answer Comment  Walker: With 5 Inch Wheels   Patient needs a walker to treat with the following condition Weakness      01/05/23 1133            Disposition and follow-up:   Melody Robles was discharged from [facility] in Good condition.  At the hospital follow up visit please address:  1.  Follow-up:  a. HFrEF: Continue to titrate up GDMT outpatient. Ensure patient is compliant with medications and refill medications.    b. CVA: Evaluate eliquis adherence and ensure no worsening neurologic symptoms.    c. Type II Diabetes: Evaluate metformin adherence/adverse effects. Titrate up as tolerated. A1c in hospital was 8.0, recheck in 3 months.   d. HLD: Evaluate for atorvastatin adherence.  Repeat lipid panel in 6 to 8 months.  2.  Labs / imaging needed at time of follow-up: BMP, CBC  3.  Pending labs/ test needing follow-up: N/A  4.  Medication Changes  Started: eliquis, metformin, atorvastatin, jardiance, aldactone, metoprolol succinate  Stopped: aspirin, lisinopril, hydrochlorothiazide Follow-up Appointments:  Follow-up Information     [facility]. Go in 12 day(s).   Specialty: Cardiology Why: Hospital follow up 01/19/2023 @9  am PLEASE bring a current mediction list to appointment FREE valet parking, Entrance C, off [employer] information: [address] [id number] mc [place] [address] [phone]        [facility] outpatient rehab Follow up.   Why: The outpatient rehab will contact you for the first home visit. Contact information: [address], [place], [place] [address] Phone: [phone]         Christell Constant, MD Follow up on 02/10/2023.   Specialty: Cardiology Why: with his NP Marcelino Duster at 10:55 AM   please arrive 15 min early to check in Contact information: [address] [place] [address] [phone]         Rana Snare, DO. Schedule an appointment as soon as possible for a visit on 01/13/2023.   Specialty: Internal Medicine Why: Please go to your appointment at 3:45pm. Please show up. This clinic is in the ground floor of this hospital. Please show up. Contact information: [address] [place] [address] [phone]                 Hospital Course by problem list:  This is a 52 year old female with a past medical history of prior CVA who presented to the emergency department with right upper and lower extremity weakness.  Patient admitted for subacute left temporal lobe CVA found to have worsening HFrEF.   #Heart failure with reduced ejection fraction #Mitral regurgitation Patient has a past medical history of Heart failure and was started on metoprolol succinate and lisinopril back in 2021 which increased to 40-45% on most recent ECHO in January 2022. Upon evaluation for source of CVA, patient found to have worsening ejection fraction of left ventricle at 20 to 25%. There was no concern for acute exacerbation during her admission. Patient was evaluated with TEE which showed LVEF of 20-25%, with mitral regurgitation, and no sign of ASD or thrombi. Patient also was evaluated with a cardiac MRI which showed LV dilation with LVEF=19% and no other signs of abnormal depositions. During admission, we allowed for a lisinopril wash out in anticipation of starting Entresto, but did not start on discharge given patient was not able to tolerate it from a blood pressure standpoint.  Patient also was started on Jardiance and spironolactone. Patient remained on metoprolol succinate and increased to 50 mg daily.  Patient discharged on 01/06/2023. Continue  to up titrate GDMT medications outpatient.    #Subacute CVA of L temporal lobe #History of previous CVA Patient has a history of prior CVA. She was in a stressful situation which caused her to have right sided weakness and patient was rushed to emergency room as a code stroke. Patient initial CT head did not show any evidence of stroke but MRI brain revealed subacute CVA of left temporal lobe. Patient was loaded with aspirin and plavix and admitted for further work up. CTA head and neck did not show any carotid stenosis. Patient had TTE to evaluate for cardio embolic source, which revealed severely reduced ejection fraction of left ventricle with global hypokinesis. Patient had TEE for further evaluation which showed LVEF of 20-25% with no ASD or thrombi. Patient was transitioned off aspirin and Plavix and started on eliquis. Patient progressed well during her hospital  stay and right sided weakness resolved. She worked well with PT/OT. Patient was started on high intensity statin. She was discharged on eliquis and atorvastatin.   #Hypertension Blood pressure measured well during hospital stay. Patient blood pressure tolerated addition of Jardiance, spironolactone, and increase in metoprolol succinate to 50 mg. Patient discharged on 01/06/2023.   #HLD Repeat lipid panel in hospital showed LDL above goal. Increased atorvastatin 40 mg daily. Patient reported that she was previously on 10 atorvastatin but was taken off. I encouraged her to remain compliant with her medication.    #Type 2 diabetes mellitus During further work up, A1c resulted at 8.0. Patient discharged on Metformin. Continue to manage outpatient.    #CKD stage IIIa On further chart review, patient does seem to have elevated creatinine at baseline around 1.1-1.2.  Creatinine at baseline during hospital stay. With the addition of GDMT did anticipated for some elevation in creatinine but tolerated well.   Discharge Subjective:  Patient  reports left IV pain. She is up and walking and feels a little drowsy from the procedure. She states that she slept well last night. She otherwise states that she is well. She is eating and drinking well.   Discharge Exam:   BP 105/82 (BP Location: Left Arm)   Pulse 69   Temp 97.8 F (36.6 C) (Oral)   Resp 18   Ht 5\' 6"  (1.676 m)   Wt 113 kg   SpO2 99%   BMI 40.21 kg/m  Constitutional: well-appearing, sitting in bed, in no acute distress HENT: normocephalic atraumatic Eyes: conjunctiva non-erythematous Cardiovascular: regular rate and rhythm, no m/r/g Pulmonary/Chest: normal work of breathing on room air, lungs clear to auscultation bilaterally Psych: pleasant, normal mood and affect   Pertinent Labs, Studies, and Procedures:     Latest Ref Rng & Units 01/06/2023    3:52 AM 01/04/2023   12:48 AM 01/03/2023    3:32 AM  CBC  WBC 4.0 - 10.5 K/uL 6.2  5.7    Hemoglobin 12.0 - 15.0 g/dL 16.1  09.6  04.5   Hematocrit 36.0 - 46.0 % 37.3  38.7  43.0   Platelets 150 - 400 K/uL 298  298         Latest Ref Rng & Units 01/06/2023    3:52 AM 01/05/2023    3:34 AM 01/04/2023   12:48 AM  CMP  Glucose 70 - 99 mg/dL 409  811  914   BUN 6 - 20 mg/dL 17  18  16    Creatinine 0.44 - 1.00 mg/dL 7.82  9.56  2.13   Sodium 135 - 145 mmol/L 138  138  137   Potassium 3.5 - 5.1 mmol/L 4.1  3.9  3.8   Chloride 98 - 111 mmol/L 105  108  107   CO2 22 - 32 mmol/L 24  23  21    Calcium 8.9 - 10.3 mg/dL 8.8  8.9  8.7     ECHOCARDIOGRAM COMPLETE BUBBLE STUDY  Result Date: 01/04/2023    ECHOCARDIOGRAM REPORT   Patient Name:   CANTRELL MARTUS Date of Exam: 01/04/2023 Medical Rec #:  [id number]          Height:       66.0 in Accession #:    [id number]         Weight:       250.0 lb Date of Birth:  Aug 25, 1970          BSA:  2.199 m Patient Age:    51 years           BP:           142/89 mmHg Patient Gender: F                  HR:           93 bpm. Exam Location:  Inpatient Procedure: 2D Echo, Cardiac  Doppler, Color Doppler, Saline Contrast Bubble Study            and Intracardiac Opacification Agent Indications:    stroke  History:        Patient has prior history of Echocardiogram examinations, most                 recent 08/06/2020. Stroke; Risk Factors:Current Smoker,                 Hypertension, Diabetes and Dyslipidemia.  Sonographer:    Delcie Roch RDCS Referring Phys: [id number] [facility] IMPRESSIONS  1. Left ventricular ejection fraction, by estimation, is 20 to 25%. The left ventricle has severely decreased function. The left ventricle demonstrates global hypokinesis. The left ventricular internal cavity size was severely dilated. Left ventricular diastolic parameters are indeterminate.  2. Right ventricular systolic function is normal. The right ventricular size is normal. Tricuspid regurgitation signal is inadequate for assessing PA pressure.  3. Left atrial size was mildly dilated.  4. The mitral valve is abnormal. Moderate to severe mitral valve regurgitation. No evidence of mitral stenosis.  5. The aortic valve is tricuspid. Aortic valve regurgitation is not visualized. No aortic stenosis is present.  6. The inferior vena cava is normal in size with greater than 50% respiratory variability, suggesting right atrial pressure of 3 mmHg.  7. Agitated saline contrast bubble study was negative, with no evidence of any interatrial shunt. Comparison(s): Prior images unable to be directly viewed, comparison made by report only. Changes from prior study are noted. Prior echo 08/06/20 at [facility] with reported EF 40-45%, worse on current study. Conclusion(s)/Recommendation(s): Severely dilated LV, with severely reduced LVEF. Very eccentric MR, likely moderate-severe. Findings communicated to primary team. FINDINGS  Left Ventricle: Left ventricular ejection fraction, by estimation, is 20 to 25%. The left ventricle has severely decreased function. The left ventricle demonstrates global hypokinesis.  Definity contrast agent was given IV to delineate the left ventricular endocardial borders. The left ventricular internal cavity size was severely dilated. There is no left ventricular hypertrophy. Left ventricular diastolic parameters are indeterminate. Right Ventricle: The right ventricular size is normal. No increase in right ventricular wall thickness. Right ventricular systolic function is normal. Tricuspid regurgitation signal is inadequate for assessing PA pressure. Left Atrium: Left atrial size was mildly dilated. Right Atrium: Right atrial size was normal in size. Pericardium: There is no evidence of pericardial effusion. Mitral Valve: Highly eccentric MR jet, moderate-severe. The mitral valve is abnormal. There is mild thickening of the mitral valve leaflet(s). There is mild calcification of the mitral valve leaflet(s). Moderate to severe mitral valve regurgitation. No evidence of mitral valve stenosis. Tricuspid Valve: The tricuspid valve is grossly normal. Tricuspid valve regurgitation is trivial. No evidence of tricuspid stenosis. Aortic Valve: The aortic valve is tricuspid. Aortic valve regurgitation is not visualized. No aortic stenosis is present. Pulmonic Valve: The pulmonic valve was not well visualized. Pulmonic valve regurgitation is trivial. No evidence of pulmonic stenosis. Aorta: The aortic root, ascending aorta and aortic arch are all structurally normal, with no evidence  of dilitation or obstruction. Venous: The inferior vena cava is normal in size with greater than 50% respiratory variability, suggesting right atrial pressure of 3 mmHg. IAS/Shunts: The atrial septum is grossly normal. Agitated saline contrast was given intravenously to evaluate for intracardiac shunting. Agitated saline contrast bubble study was negative, with no evidence of any interatrial shunt.  LEFT VENTRICLE PLAX 2D LVIDd:         7.10 cm      Diastology LVIDs:         6.36 cm      LV e' medial:    7.18 cm/s LV PW:          1.00 cm      LV E/e' medial:  14.3 LV IVS:        0.90 cm      LV e' lateral:   6.09 cm/s LVOT diam:     2.20 cm      LV E/e' lateral: 16.9 LV SV:         44 LV SV Index:   20 LVOT Area:     3.80 cm  LV Volumes (MOD) LV vol d, MOD A2C: 246.0 ml LV vol s, MOD A2C: 174.0 ml LV SV MOD A2C:     72.0 ml RIGHT VENTRICLE             IVC RV Basal diam:  2.10 cm     IVC diam: 2.00 cm RV S prime:     10.20 cm/s TAPSE (M-mode): 1.9 cm LEFT ATRIUM             Index        RIGHT ATRIUM          Index LA diam:        3.80 cm 1.73 cm/m   RA Area:     9.43 cm LA Vol (A2C):   65.7 ml 29.88 ml/m  RA Volume:   17.70 ml 8.05 ml/m LA Vol (A4C):   64.1 ml 29.15 ml/m LA Biplane Vol: 68.4 ml 31.11 ml/m  AORTIC VALVE LVOT Vmax:   75.40 cm/s LVOT Vmean:  44.400 cm/s LVOT VTI:    0.116 m  AORTA Ao Root diam: 2.70 cm Ao Asc diam:  3.00 cm MR Peak grad: 124.5 mmHg MR Mean grad: 77.0 mmHg     SHUNTS MR Vmax:      558.00 cm/s   Systemic VTI:  0.12 m MR Vmean:     410.0 cm/s    Systemic Diam: 2.20 cm MV E velocity: 103.00 cm/s MV A velocity: 136.00 cm/s MV E/A ratio:  0.76 Jodelle Red MD Electronically signed by Jodelle Red MD Signature Date/Time: 01/04/2023/3:40:08 PM    Final    CT ANGIO HEAD NECK W WO CM  Result Date: 01/03/2023 CLINICAL DATA:  Stroke suspected, determine embolic source EXAM: CT ANGIOGRAPHY HEAD AND NECK WITH AND WITHOUT CONTRAST TECHNIQUE: Multidetector CT imaging of the head and neck was performed using the standard protocol during bolus administration of intravenous contrast. Multiplanar CT image reconstructions and MIPs were obtained to evaluate the vascular anatomy. Carotid stenosis measurements (when applicable) are obtained utilizing NASCET criteria, using the distal internal carotid diameter as the denominator. RADIATION DOSE REDUCTION: This exam was performed according to the departmental dose-optimization program which includes automated exposure control, adjustment of the mA and/or  kV according to patient size and/or use of iterative reconstruction technique. CONTRAST:  75mL OMNIPAQUE IOHEXOL 350 MG/ML SOLN COMPARISON:  Brain MRI from earlier today  FINDINGS: CTA NECK FINDINGS Aortic arch: Normal with 3 vessel branching. Right carotid system: Vessels are smoothly contoured and widely patent without atherosclerosis. Left carotid system: Vessels are smoothly contoured and widely patent without atherosclerosis. Vertebral arteries: Vessels are smoothly contoured and widely patent without atherosclerosis. Strong right vertebral artery dominance. Skeleton: Ordinary cervical spine degeneration. Other neck: Unremarkable Upper chest: Clear apical lungs Review of the MIP images confirms the above findings CTA HEAD FINDINGS Anterior circulation: No significant stenosis, proximal occlusion, aneurysm, or vascular malformation. Posterior circulation: Strong right vertebral artery dominance. Vessels are smoothly contoured and diffusely patent. No branch occlusion or aneurysm Venous sinuses: Normal Anatomic variants: None significant Review of the MIP images confirms the above findings IMPRESSION: Normal CTA of the head and neck. Electronically Signed   By: Tiburcio Pea M.D.   On: 01/03/2023 09:54   MR BRAIN W CONTRAST  Result Date: 01/03/2023 CLINICAL DATA:  Headache.  Abnormal left temporal lobe. EXAM: MRI HEAD WITH CONTRAST TECHNIQUE: Multiplanar, multiecho pulse sequences of the brain and surrounding structures were obtained with intravenous contrast. CONTRAST:  10mL GADAVIST GADOBUTROL 1 MMOL/ML IV SOLN COMPARISON:  Noncontrast brain MRI from earlier today FINDINGS: The anterior and lateral left temporal cortex abnormality is T1 hyperintense by prior brain MRI and shows cortically based enhancement. On T2 weighted imaging there is suggestion of early volume loss and subacute infarct is favored. There is somewhat unusual extension into the subinsular white matter and follow-up is recommended in this  young patient to exclude mass or inflammatory process. No other areas of abnormal enhancement. IMPRESSION: T1 hyperintense and enhancing cortex at the left temporal lobe abnormality, favor subacute infarct. Follow-up with contrast in approximately 2 months is recommended to confirm resolution. Electronically Signed   By: Tiburcio Pea M.D.   On: 01/03/2023 06:58   MR BRAIN WO CONTRAST  Result Date: 01/03/2023 CLINICAL DATA:  Neuro deficit with acute stroke suspected EXAM: MRI HEAD WITHOUT CONTRAST TECHNIQUE: Multiplanar, multiecho pulse sequences of the brain and surrounding structures were obtained without intravenous contrast. COMPARISON:  Head CT from earlier today in brain MRI 10/03/2019 FINDINGS: Brain: No acute infarction, hemorrhage, hydrocephalus, extra-axial collection or mass lesion. A small diffusion hyperintense focus in the left corona radiata demonstrates shine through on ADC map. There is a small area of new FLAIR hyperintensity with linear T1 hyperintensity along the superficial left temporal lobe with volume loss best seen on axial T2 weighted imaging, consistent with encephalomalacia. This could be posttraumatic or post ischemic in this location, minimal chronic blood products suggested on gradient imaging. Few tiny FLAIR hyperintensities in the cerebral white matter with nonspecific pattern. Brain volume is normal. Vascular: Normal flow voids. Skull and upper cervical spine: Normal marrow signal. Sinuses/Orbits: Minimal left mastoid opacification. Negative orbits. IMPRESSION: 1. No acute finding. 2. Small area of cortical encephalomalacia in the left temporal lobe since 2021 3. Mild white matter disease with nonspecific pattern, history of hypertension favoring chronic microvascular ischemia. Electronically Signed   By: Tiburcio Pea M.D.   On: 01/03/2023 04:32   CT HEAD CODE STROKE WO CONTRAST  Result Date: 01/03/2023 CLINICAL DATA:  Code stroke. Acute neurologic deficit. Slurred  speech and right-sided weakness. EXAM: CT HEAD WITHOUT CONTRAST TECHNIQUE: Contiguous axial images were obtained from the base of the skull through the vertex without intravenous contrast. RADIATION DOSE REDUCTION: This exam was performed according to the departmental dose-optimization program which includes automated exposure control, adjustment of the mA and/or kV according to patient size and/or use  of iterative reconstruction technique. COMPARISON:  None Available. FINDINGS: Brain: There is no mass, hemorrhage or extra-axial collection. The size and configuration of the ventricles and extra-axial CSF spaces are normal. The brain parenchyma is normal, without evidence of acute or chronic infarction. Vascular: No abnormal hyperdensity of the major intracranial arteries or dural venous sinuses. No intracranial atherosclerosis. Skull: The visualized skull base, calvarium and extracranial soft tissues are normal. Sinuses/Orbits: No fluid levels or advanced mucosal thickening of the visualized paranasal sinuses. No mastoid or middle ear effusion. The orbits are normal. ASPECTS [facility] Stroke Program Early CT Score) - Ganglionic level infarction (caudate, lentiform nuclei, internal capsule, insula, M1-M3 cortex): 7 - Supraganglionic infarction (M4-M6 cortex): 3 Total score (0-10 with 10 being normal): 10 IMPRESSION: 1. No acute intracranial abnormality. 2. ASPECTS is 10. These results were communicated to Dr. Erick Blinks at 3:39 am on 01/03/2023 by text page via the [facility] messaging system. Electronically Signed   By: Deatra Robinson M.D.   On: 01/03/2023 03:40     Discharge Instructions: Discharge Instructions     Ambulatory referral to Physical Therapy   Complete by: As directed    Call MD for:  difficulty breathing, headache or visual disturbances   Complete by: As directed    Call MD for:  extreme fatigue   Complete by: As directed    Call MD for:  persistant dizziness or light-headedness   Complete  by: As directed    Call MD for:  redness, tenderness, or signs of infection (pain, swelling, redness, odor or green/yellow discharge around incision site)   Complete by: As directed    Call MD for:  temperature >100.4   Complete by: As directed    Diet - low sodium heart healthy   Complete by: As directed    Discharge instructions   Complete by: As directed    Melody Robles, It was a [facility] taking care of you at [facility]. You were admitted for a stroke and heart failure. We are discharging you home now that you are doing better. Please follow the following instructions.   1) For your stroke, please start taking Eliquis 5 mg twice daily, atorvastatin 40 mg daily. Eliquis is a blood thinner, so if you notice any bleeding please let your doctor know. As you are following up with the internal medicine clinic you can call our office at [phone].  2) For your diabetes please start taking metformin 500 mg daily.   3) Regarding your heart failure, your ultrasound of your heart showed a low ejection fraction of 20-25%. We have also started medications for your heart failure which include Jardiance 10 mg daily, metoprolol succinate milligrams daily, and spironolactone 12.5 mg daily (half tablet). Please do not miss any doses.  4) You have an appointment with the internal medicine clinic which in the basement of this hospital. Your appointment is on 01/13/2023 with Dr. Sherrilee Gilles at 3:45pm, please show up.  5) You also have an appointment with your cardiologist on 01/19/2023 please show up to this point.  We are sending you home on a loop recorder, your cardiologist will follow-up the results to evaluate for any arrhythmias in your heart.  6) If you develop any stroke like symptoms, please return back to the emergency department.   7) We have stopped your aspirin and your lisinopril.  Stop taking this medicine.  Take care,  Dr. Modena Slater, DO   Increase activity slowly   Complete  by: As directed  Signed: Modena Slater, DO PGY-1 01/06/2023, 1:10 PM   Pager: [phone]

## 2023-01-07 ENCOUNTER — Encounter (HOSPITAL_COMMUNITY): Payer: Self-pay | Admitting: Internal Medicine

## 2023-01-08 DIAGNOSIS — I639 Cerebral infarction, unspecified: Secondary | ICD-10-CM

## 2023-01-08 DIAGNOSIS — I493 Ventricular premature depolarization: Secondary | ICD-10-CM

## 2023-01-08 DIAGNOSIS — I5020 Unspecified systolic (congestive) heart failure: Secondary | ICD-10-CM | POA: Diagnosis not present

## 2023-01-08 DIAGNOSIS — I4729 Other ventricular tachycardia: Secondary | ICD-10-CM

## 2023-01-12 NOTE — Anesthesia Postprocedure Evaluation (Signed)
Anesthesia Post Note  Patient: FLORASTINE MECKES  Procedure(s) Performed: TRANSESOPHAGEAL ECHOCARDIOGRAM     Patient location during evaluation: Cath Lab Anesthesia Type: MAC Level of consciousness: awake and alert Pain management: pain level controlled Vital Signs Assessment: post-procedure vital signs reviewed and stable Respiratory status: spontaneous breathing, nonlabored ventilation, respiratory function stable and patient connected to nasal cannula oxygen Cardiovascular status: stable and blood pressure returned to baseline Postop Assessment: no apparent nausea or vomiting Anesthetic complications: no   No notable events documented.  Last Vitals:  Vitals:   01/06/23 0940 01/06/23 1207  BP: 123/76 105/82  Pulse: 81 69  Resp: 19 18  Temp:  36.6 C  SpO2: 99% 99%    Last Pain:  Vitals:   01/06/23 1207  TempSrc: Oral  PainSc:                  Gailyn Crook

## 2023-01-13 ENCOUNTER — Telehealth: Payer: Self-pay | Admitting: *Deleted

## 2023-01-13 ENCOUNTER — Telehealth: Payer: Self-pay | Admitting: Student in an Organized Health Care Education/Training Program

## 2023-01-13 ENCOUNTER — Ambulatory Visit (INDEPENDENT_AMBULATORY_CARE_PROVIDER_SITE_OTHER): Payer: 59 | Admitting: Student

## 2023-01-13 ENCOUNTER — Ambulatory Visit: Payer: 59

## 2023-01-13 ENCOUNTER — Encounter: Payer: Self-pay | Admitting: Student

## 2023-01-13 VITALS — BP 146/115 | HR 78 | Temp 98.1°F | Ht 66.0 in | Wt 256.2 lb

## 2023-01-13 VITALS — BP 122/81 | HR 60 | Temp 98.1°F | Ht 66.0 in | Wt 256.2 lb

## 2023-01-13 DIAGNOSIS — D509 Iron deficiency anemia, unspecified: Secondary | ICD-10-CM

## 2023-01-13 DIAGNOSIS — Z7984 Long term (current) use of oral hypoglycemic drugs: Secondary | ICD-10-CM

## 2023-01-13 DIAGNOSIS — I110 Hypertensive heart disease with heart failure: Secondary | ICD-10-CM

## 2023-01-13 DIAGNOSIS — Z8673 Personal history of transient ischemic attack (TIA), and cerebral infarction without residual deficits: Secondary | ICD-10-CM

## 2023-01-13 DIAGNOSIS — I10 Essential (primary) hypertension: Secondary | ICD-10-CM

## 2023-01-13 DIAGNOSIS — I5020 Unspecified systolic (congestive) heart failure: Secondary | ICD-10-CM

## 2023-01-13 DIAGNOSIS — Z6841 Body Mass Index (BMI) 40.0 and over, adult: Secondary | ICD-10-CM

## 2023-01-13 DIAGNOSIS — E785 Hyperlipidemia, unspecified: Secondary | ICD-10-CM

## 2023-01-13 DIAGNOSIS — Z0000 Encounter for general adult medical examination without abnormal findings: Secondary | ICD-10-CM | POA: Diagnosis not present

## 2023-01-13 DIAGNOSIS — E119 Type 2 diabetes mellitus without complications: Secondary | ICD-10-CM

## 2023-01-13 DIAGNOSIS — Z87891 Personal history of nicotine dependence: Secondary | ICD-10-CM

## 2023-01-13 MED ORDER — APIXABAN 5 MG PO TABS: 5.0000 mg | ORAL_TABLET | Freq: Two times a day (BID) | ORAL | 3 refills | Status: DC

## 2023-01-13 MED ORDER — METFORMIN HCL 500 MG PO TABS
500.0000 mg | ORAL_TABLET | Freq: Two times a day (BID) | ORAL | 3 refills | Status: DC
Start: 2023-01-13 — End: 2023-05-02

## 2023-01-13 MED ORDER — ATORVASTATIN CALCIUM 40 MG PO TABS: 40.0000 mg | ORAL_TABLET | Freq: Every day | ORAL | 3 refills | Status: DC

## 2023-01-13 MED ORDER — EMPAGLIFLOZIN 10 MG PO TABS
10.0000 mg | ORAL_TABLET | Freq: Every day | ORAL | 3 refills | Status: DC
Start: 2023-01-13 — End: 2023-12-21

## 2023-01-13 MED ORDER — METOPROLOL SUCCINATE ER 50 MG PO TB24
50.0000 mg | ORAL_TABLET | Freq: Every day | ORAL | 3 refills | Status: DC
Start: 2023-01-13 — End: 2023-03-05

## 2023-01-13 MED ORDER — SPIRONOLACTONE 25 MG PO TABS: 25.0000 mg | ORAL_TABLET | Freq: Every day | ORAL | 3 refills | Status: DC

## 2023-01-13 NOTE — Telephone Encounter (Signed)
   Cardiac Monitor Alert  Date of alert:  01/13/2023   Patient Name: Melody Robles  DOB: [birth year]  MRN: [id number]   [facility] Cardiologist: Christell Constant, MD   [facility] EP:  None    Monitor Information: Cardiac Event Monitor [[employer]]  Reason:  PVCs Ordering provider:  Dr. Roda Shutters   Alert PVCs - Kirby Funk This is the 1st alert for this rhythm.   Next Cardiology Appointment   Date:  02/10/23  Provider:  Lissa Hoard  The patient was contacted today.  She is asymptomatic. Arrhythmia, symptoms and history reviewed with Dr. Tenny Craw, DOD.   Plan:  continue monitoring.   Other: Pt advised to call the office if she felt any palpitations/symptoms  Baird Lyons, RN  01/13/2023 8:38 AM

## 2023-01-13 NOTE — Assessment & Plan Note (Signed)
Patient declined pap smear and referral for colonoscopy at this time. Will reassess at next visit.

## 2023-01-13 NOTE — Assessment & Plan Note (Addendum)
Last A1c 8% in June while in hospital. Discharged on metformin 500 mg daily and Jardiance 10 mg daily. Tolerating well without side effects and reports adherence. Initially metformin caused diarrhea but resolved. Will titrate up metformin which patient agrees. Declined starting GLP-1 since she does not want to start injections at this time even though counseled her on potential weight loss benefits with GLP-1. Foot exam completed today. Unable to void today, defer urine microalbumin to next OV.   Plan -Increase metformin to 500 mg BID then continue titrate up as tolerated -Continue Jardiance 10 mg  -Referral to ophthalmology  -Repeat A1c in 2.5 months  -Check urine microalbumin at next visit

## 2023-01-13 NOTE — Assessment & Plan Note (Signed)
Last CBC during hospitalization showed barely microcytic anemia with Hgb 11.6 and MCV 79.9. Denies any signs of bleeding. Will recheck CBC today.

## 2023-01-13 NOTE — Assessment & Plan Note (Addendum)
Hospitalized for subacute left temporal infarct. Found on TTE that EF reduced to 20-25%. No findings of thrombi on TEE. Patient discharged on Eliquis 5 mg BID. Tolerating well with daily adherence. She still has cardiac monitor in place. States weakness has improved. Neuro exam showed 5/5 strength in all extremities and no focal deficits. Will continue with risk stratifications. Has [facility] appointment in August.   Plan -Continue Eliquis 5 mg BID -F/u [facility] in August  -F/u cardiac monitor results, has f/u with cardiology later this month

## 2023-01-13 NOTE — Progress Notes (Signed)
Subjective:   Melody Robles is a 52 y.o. female who presents for an Initial Medicare Annual Wellness Visit.  Visit Complete: In person  Patient Medicare AWV questionnaire was completed by the patient on 01/13/2023; I have confirmed that all information answered by patient is correct and no changes since this date.  Review of Systems    Defer to PCP      Objective:    Today's Vitals   01/13/23 1532 01/13/23 1533  BP: (!) 146/115   Pulse: 78   Temp: 98.1 F (36.7 C)   SpO2: 100%   Weight: 256 lb 3.2 oz (116.2 kg)   Height: 5\' 6"  (1.676 m)   PainSc:  0-No pain   Body mass index is 41.35 kg/m.     01/13/2023    3:33 PM 01/13/2023    2:56 PM 01/06/2023    8:00 AM 01/04/2023   10:09 PM 01/03/2023    3:32 AM 09/11/2022    9:48 AM 08/28/2022    4:17 PM  Advanced Directives  Does Patient Have a Medical Advance Directive? No No No No No No No  Would patient like information on creating a medical advance directive? No - Patient declined No - Patient declined  Yes (Inpatient - patient requests chaplain consult to create a medical advance directive) No - Patient declined      Current Medications (verified) Outpatient Encounter Medications as of 01/13/2023  Medication Sig   apixaban (ELIQUIS) 5 MG TABS tablet Take 1 tablet (5 mg total) by mouth 2 (two) times daily.   atorvastatin (LIPITOR) 40 MG tablet Take 1 tablet (40 mg total) by mouth daily.   empagliflozin (JARDIANCE) 10 MG TABS tablet Take 1 tablet (10 mg total) by mouth daily.   metFORMIN (GLUCOPHAGE) 500 MG tablet Take 1 tablet (500 mg total) by mouth daily with breakfast.   metoprolol succinate (TOPROL-XL) 50 MG 24 hr tablet Take 1 tablet (50 mg total) by mouth daily. Take with or immediately following a meal.   spironolactone (ALDACTONE) 25 MG tablet Take 0.5 tablets (12.5 mg total) by mouth daily.   No facility-administered encounter medications on file as of 01/13/2023.    Allergies (verified) Benadryl  [diphenhydramine] and Morphine   History: Past Medical History:  Diagnosis Date   Hypertension    Stroke [facility])    Past Surgical History:  Procedure Laterality Date   CHOLECYSTECTOMY     TEE WITHOUT CARDIOVERSION N/A 01/06/2023   Procedure: TRANSESOPHAGEAL ECHOCARDIOGRAM;  Surgeon: Maisie Fus, MD;  Location: [facility] INVASIVE CV LAB;  Service: Cardiovascular;  Laterality: N/A;   Family History  Problem Relation Age of Onset   Hypertension Mother    Diabetes Mother    Social History   Socioeconomic History   Marital status: Single    Spouse name: Not on file   Number of children: 3   Years of education: Not on file   Highest education level: 11th grade  Occupational History   Occupation: disability  Tobacco Use   Smoking status: Former    Years: 30    Types: Cigarettes    Quit date: 12/22/2022    Years since quitting: 0.0   Smokeless tobacco: Never  Vaping Use   Vaping Use: Never used  Substance and Sexual Activity   Alcohol use: Never   Drug use: Never   Sexual activity: Not on file  Other Topics Concern   Not on file  Social History Narrative   Not on file  Social Determinants of Health   Financial Resource Strain: Low Risk  (01/13/2023)   Overall Financial Resource Strain (CARDIA)    Difficulty of Paying Living Expenses: Not hard at all  Food Insecurity: No Food Insecurity (01/13/2023)   Hunger Vital Sign    Worried About Running Out of Food in the Last Year: Never true    Ran Out of Food in the Last Year: Never true  Transportation Needs: No Transportation Needs (01/13/2023)   PRAPARE - Administrator, Civil Service (Medical): No    Lack of Transportation (Non-Medical): No  Physical Activity: Inactive (01/13/2023)   Exercise Vital Sign    Days of Exercise per Week: 0 days    Minutes of Exercise per Session: 0 min  Stress: No Stress Concern Present (01/13/2023)   [employer] of Occupational Health - Occupational Stress Questionnaire    Feeling of  Stress : Not at all  Social Connections: Moderately Isolated (01/13/2023)   Social Connection and Isolation Panel [NHANES]    Frequency of Communication with Friends and Family: More than three times a week    Frequency of Social Gatherings with Friends and Family: Three times a week    Attends Religious Services: More than 4 times per year    Active Member of Clubs or Organizations: No    Attends Banker Meetings: Never    Marital Status: Never married    Tobacco Counseling Counseling given: Not Answered   Clinical Intake:  Pre-visit preparation completed: Yes  Pain : No/denies pain Pain Score: 0-No pain     Nutritional Risks: None Diabetes: No  How often do you need to have someone help you when you read instructions, pamphlets, or other written materials from your doctor or pharmacy?: 1 - Never What is the last grade level you completed in school?: 11th grade  Interpreter Needed?: No  Information entered by :: Anayi Bricco,cma   Activities of Daily Living    01/13/2023    3:33 PM 01/13/2023    2:56 PM  In your present state of health, do you have any difficulty performing the following activities:  Hearing? 0 0  Vision? 0 0  Difficulty concentrating or making decisions? 0 0  Walking or climbing stairs? 1 1  Dressing or bathing? 0 0  Doing errands, shopping? 0 0    Patient Care Team: Rana Snare, DO as PCP - General (Internal Medicine) Christell Constant, MD as PCP - Cardiology (Cardiology)  Indicate any recent Medical Services you may have received from other than [facility] providers in the past year (date may be approximate).     Assessment:   This is a routine wellness examination for Melody Robles.  Hearing/Vision screen No results found.  Dietary issues and exercise activities discussed:     Goals Addressed   None   Depression Screen    01/13/2023    3:34 PM  PHQ 2/9 Scores  PHQ - 2 Score 0    Fall Risk    01/13/2023    3:34 PM  01/13/2023    2:56 PM  Fall Risk   Falls in the past year? 1 1  Number falls in past yr: 1 1  Injury with Fall? 0 0  Risk for fall due to : No Fall Risks No Fall Risks  Follow up Falls evaluation completed;Falls prevention discussed Falls evaluation completed    MEDICARE RISK AT HOME:   TIMED UP AND GO:  Was the test performed? No  Cognitive Function:        01/13/2023    3:34 PM  6CIT Screen  What Year? 0 points  What month? 0 points  What time? 0 points  Count back from 20 0 points  Months in reverse 0 points  Repeat phrase 0 points  Total Score 0 points    Immunizations  There is no immunization history on file for this patient.  TDAP status: Due, Education has been provided regarding the importance of this vaccine. Advised may receive this vaccine at local pharmacy or Health Dept. Aware to provide a copy of the vaccination record if obtained from local pharmacy or Health Dept. Verbalized acceptance and understanding.  Flu Vaccine status: Up to date  Pneumococcal vaccine status: Due, Education has been provided regarding the importance of this vaccine. Advised may receive this vaccine at local pharmacy or Health Dept. Aware to provide a copy of the vaccination record if obtained from local pharmacy or Health Dept. Verbalized acceptance and understanding.  Covid-19 vaccine status: Information provided on how to obtain vaccines.   Qualifies for Shingles Vaccine? No   Zostavax completed No   Shingrix Completed?: No.    Education has been provided regarding the importance of this vaccine. Patient has been advised to call insurance company to determine out of pocket expense if they have not yet received this vaccine. Advised may also receive vaccine at local pharmacy or Health Dept. Verbalized acceptance and understanding.  Screening Tests Health Maintenance  Topic Date Due   COVID-19 Vaccine (1) Never done   FOOT EXAM  Never done   OPHTHALMOLOGY EXAM  Never done    Diabetic kidney evaluation - Urine ACR  Never done   Hepatitis C Screening  Never done   DTaP/Tdap/Td (1 - Tdap) Never done   PAP SMEAR-Modifier  Never done   Colonoscopy  Never done   INFLUENZA VACCINE  02/11/2023   HEMOGLOBIN A1C  07/05/2023   Diabetic kidney evaluation - eGFR measurement  01/06/2024   Medicare Annual Wellness (AWV)  01/13/2024   HIV Screening  Completed   HPV VACCINES  Aged Out    Health Maintenance  Health Maintenance Due  Topic Date Due   COVID-19 Vaccine (1) Never done   FOOT EXAM  Never done   OPHTHALMOLOGY EXAM  Never done   Diabetic kidney evaluation - Urine ACR  Never done   Hepatitis C Screening  Never done   DTaP/Tdap/Td (1 - Tdap) Never done   PAP SMEAR-Modifier  Never done   Colonoscopy  Never done     Lung Cancer Screening: (Low Dose CT Chest recommended if Age 44-80 years, 20 pack-year currently smoking OR have quit w/in 15years.) does not qualify.   Lung Cancer Screening Referral: N/A  Additional Screening:  Hepatitis C Screening: does not qualify; Completed N/A  Vision Screening: Recommended annual ophthalmology exams for early detection of glaucoma and other disorders of the eye. Is the patient up to date with their annual eye exam?  No  Who is the provider or what is the name of the office in which the patient attends annual eye exams? N/A If pt is not established with a provider, would they like to be referred to a provider to establish care? No .   Dental Screening: Recommended annual dental exams for proper oral hygiene   Community Resource Referral / Chronic Care Management: CRR required this visit?  No   CCM required this visit?  No     Plan:  I have personally reviewed and noted the following in the patient's chart:   Medical and social history Use of alcohol, tobacco or illicit drugs  Current medications and supplements including opioid prescriptions. Patient is not currently taking opioid  prescriptions. Functional ability and status Nutritional status Physical activity Advanced directives List of other physicians Hospitalizations, surgeries, and ER visits in previous 12 months Vitals Screenings to include cognitive, depression, and falls Referrals and appointments  In addition, I have reviewed and discussed with patient certain preventive protocols, quality metrics, and best practice recommendations. A written personalized care plan for preventive services as well as general preventive health recommendations were provided to patient.     Cala Bradford, CMA   01/13/2023   After Visit Summary: (Mail) Due to this being a telephonic visit, the after visit summary with patients personalized plan was offered to patient via mail   Nurse Notes: Face-To-Face Visit  Ms. Morman , Thank you for taking time to come for your Medicare Wellness Visit. I appreciate your ongoing commitment to your health goals. Please review the following plan we discussed and let me know if I can assist you in the future.   These are the goals we discussed:  Goals   None     This is a list of the screening recommended for you and due dates:  Health Maintenance  Topic Date Due   COVID-19 Vaccine (1) Never done   Complete foot exam   Never done   Eye exam for diabetics  Never done   Yearly kidney health urinalysis for diabetes  Never done   Hepatitis C Screening  Never done   DTaP/Tdap/Td vaccine (1 - Tdap) Never done   Pap Smear  Never done   Colon Cancer Screening  Never done   Flu Shot  02/11/2023   Hemoglobin A1C  07/05/2023   Yearly kidney function blood test for diabetes  01/06/2024   Medicare Annual Wellness Visit  01/13/2024   HIV Screening  Completed   HPV Vaccine  Aged Out

## 2023-01-13 NOTE — Assessment & Plan Note (Signed)
EF of 20-25%. Cardiac MRI showed EF 19%. Discharged on GDMT including Jardiance 10 mg, Toprol 50 mg daily and spironolactone 12.5 mg daily (taking 25 mg daily). Does not appear volume overloaded today. BP stable at 122/81. Will continue with current GDMT and has cardiology f/u later this month.   Plan -Continue Jardiance 10 mg, Toprol 50 mg and Spironolactone 25 mg  -F/u with cardiology later this month

## 2023-01-13 NOTE — Assessment & Plan Note (Signed)
BMI 41.35. States working on dietary modifications given diagnosis of T2DM. Counseled her on lifestyle modifications which she will try. Declined starting GLP-1 at this time, we can discuss at future visits if she is interested.

## 2023-01-13 NOTE — Significant Event (Signed)
Ambulatory alarm with 10 beats of NSVT. Not first event on record.She had a similar event earlier today, also 10 beats. No action.    Rosita Kea, MD MS  Cardiology [facility]

## 2023-01-13 NOTE — Assessment & Plan Note (Signed)
LDL above goal 171. Patient is now taking atorvastatin 40 mg daily, tolerating well and daily adherence. Will repeat lipid panel at next visit.   Plan -Continue atorvastatin 40 mg  -Repeat lipid panel at next OV

## 2023-01-13 NOTE — Patient Instructions (Addendum)
Thank you, [name] for allowing [place] to provide your care today. Today we discussed your recent hospitalization.   -I am glad the weakness has resolved.  -I will send refills for your medications to [facility] today.  -You can continue taking spironolactone 25 mg daily.  -We will increase metformin to 500 mg two times a day for 1 week. If doing well, then increase to 1000 mg (2 tablets) in morning and 1 tablet in evening for 1 week. If doing well after that, then increase to max dose 1000 mg (2 tablets) two times a day.  -Will do blood work today to check electrolytes and kidney function.  -Referral sent for eye doctor today.   I have ordered the following labs for you:  Lab Orders         BMP8+Anion Gap         CBC no Diff      Referrals ordered today:   Referral Orders         Ambulatory referral to Ophthalmology      I have ordered the following medication/changed the following medications:   Stop the following medications: Medications Discontinued During This Encounter  Medication Reason   metFORMIN (GLUCOPHAGE) 500 MG tablet Reorder   apixaban (ELIQUIS) 5 MG TABS tablet Reorder   empagliflozin (JARDIANCE) 10 MG TABS tablet Reorder   metoprolol succinate (TOPROL-XL) 50 MG 24 hr tablet Reorder   spironolactone (ALDACTONE) 25 MG tablet Reorder   atorvastatin (LIPITOR) 40 MG tablet Reorder     Start the following medications: Meds ordered this encounter  Medications   apixaban (ELIQUIS) 5 MG TABS tablet    Sig: Take 1 tablet (5 mg total) by mouth 2 (two) times daily.    Dispense:  180 tablet    Refill:  3   atorvastatin (LIPITOR) 40 MG tablet    Sig: Take 1 tablet (40 mg total) by mouth daily.    Dispense:  90 tablet    Refill:  3   empagliflozin (JARDIANCE) 10 MG TABS tablet    Sig: Take 1 tablet (10 mg total) by mouth daily.    Dispense:  90 tablet    Refill:  3   metoprolol succinate (TOPROL-XL) 50 MG 24 hr tablet    Sig: Take 1 tablet (50 mg total) by  mouth daily. Take with or immediately following a meal.    Dispense:  90 tablet    Refill:  3   spironolactone (ALDACTONE) 25 MG tablet    Sig: Take 1 tablet (25 mg total) by mouth daily.    Dispense:  90 tablet    Refill:  3   metFORMIN (GLUCOPHAGE) 500 MG tablet    Sig: Take 1 tablet (500 mg total) by mouth 2 (two) times daily with a meal. If tolerating, increase to 2 tablets in morning and 1 tablet in evening for 1 week. If tolerating that, then increase to 2 tablets (1000 mg total) two times a day with meal.    Dispense:  180 tablet    Refill:  3     Follow up: 3 months   Should you have any questions or concerns please call the internal medicine clinic at [phone].    Rana Snare, D.O. [facility]

## 2023-01-13 NOTE — Progress Notes (Signed)
CC: establish care, hospital f/u  HPI:  [name] is a 52 y.o. female living with a history stated below and presents today for establish care and hospital f/u. Please see problem based assessment and plan for additional details.  PMH -Hx CVA -HTN -HLD -T2DM -Depression  PSH Past Surgical History:  Procedure Laterality Date   CHOLECYSTECTOMY     TEE WITHOUT CARDIOVERSION N/A 01/06/2023   Procedure: TRANSESOPHAGEAL ECHOCARDIOGRAM;  Surgeon: Maisie Fus, MD;  Location: [facility] INVASIVE CV LAB;  Service: Cardiovascular;  Laterality: N/A;   Meds -Eliquis 5 mg BID -Atorvastatin 40 mg daily -Jardiance 10 mg daily -Metformin 500 mg daily  -Toprol 50 mg daily -Spironolactone 12.5 mg daily   Allergies Allergies  Allergen Reactions   Benadryl [Diphenhydramine] Itching and Rash   Morphine Itching and Rash   FH Family History  Problem Relation Age of Onset   Hypertension Mother    Diabetes Mother    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 Occupation: unemployed  Function: independent PCP: [facility] previously  Tobacco use: former, 0.5 ppd x 25 years, quit 2 weeks ago   Alcohol use: denies Illicit drug use: quit marijuana 3 years   Social History   Socioeconomic History   Marital status: Single    Spouse name: Not on file   Number of children: 3   Years of education: Not on file   Highest education level: 11th grade  Occupational History   Occupation: disability  Tobacco Use   Smoking status: Former    Years: 30    Types: Cigarettes    Quit date: 12/22/2022    Years since quitting: 0.0   Smokeless tobacco: Never  Vaping Use   Vaping Use: Never used  Substance and Sexual Activity   Alcohol use: Never   Drug use: Never   Sexual activity: Not on file  Other Topics Concern   Not on file  Social History Narrative   Not on file   Social Determinants of Health   Financial Resource Strain: Low Risk  (01/13/2023)   Overall Financial Resource Strain  (CARDIA)    Difficulty of Paying Living Expenses: Not hard at all  Food Insecurity: No Food Insecurity (01/13/2023)   Hunger Vital Sign    Worried About Running Out of Food in the Last Year: Never true    Ran Out of Food in the Last Year: Never true  Transportation Needs: No Transportation Needs (01/13/2023)   PRAPARE - Administrator, Civil Service (Medical): No    Lack of Transportation (Non-Medical): No  Physical Activity: Inactive (01/13/2023)   Exercise Vital Sign    Days of Exercise per Week: 0 days    Minutes of Exercise per Session: 0 min  Stress: No Stress Concern Present (01/13/2023)   [employer] of Occupational Health - Occupational Stress Questionnaire    Feeling of Stress : Not at all  Social Connections: Moderately Isolated (01/13/2023)   Social Connection and Isolation Panel [NHANES]    Frequency of Communication with Friends and Family: More than three times a week    Frequency of Social Gatherings with Friends and Family: Three times a week    Attends Religious Services: More than 4 times per year    Active Member of Clubs or Organizations: No    Attends Banker Meetings: Never    Marital Status: Never married  Intimate Partner Violence: Not At Risk (01/13/2023)   Humiliation, Afraid, Rape, and Kick questionnaire  Fear of Current or Ex-Partner: No    Emotionally Abused: No    Physically Abused: No    Sexually Abused: No    Review of Systems: ROS negative except for what is noted on the assessment and plan.  Vitals:   01/13/23 1451 01/13/23 1546  BP: (!) 146/115 122/81  Pulse: 76 60  Temp: 98.1 F (36.7 C)   TempSrc: Oral   SpO2: 100%   Weight: 256 lb 3.2 oz (116.2 kg)   Height: 5\' 6"  (1.676 m)    Physical Exam: Constitutional: well-appearing female sitting in chair comfortably, in no acute distress HENT: normocephalic atraumatic Cardiovascular: regular rate and rhythm, 2+ DP and PT pulses bilaterally  Pulmonary/Chest: normal  work of breathing on room air, lungs clear to auscultation bilaterally MSK: no LE edema  Neurological: alert & oriented x 3, intact sensation in all extremities, 5/5 strength in all extremities Skin: warm and dry, no open wounds over feet Psych: pleasant mood  Assessment & Plan:   HTN (hypertension) BP on repeat improved to normal 122/81 from 146/115. She is taking Toprol 50 mg daily, spironolactone 25 mg daily (not 12.5 mg daily) and Jardiance 10 mg daily. Tolerating all well without side effects and reports daily adherence. Asymptomatic today. Will continue current meds and check electrolytes and kidney function today.   Plan -Continue Toprol 50 mg, spironolactone 25 mg and Jardiance 10 mg -BMP today   Type 2 diabetes mellitus (HCC) Last A1c 8% in June while in hospital. Discharged on metformin 500 mg daily and Jardiance 10 mg daily. Tolerating well without side effects and reports adherence. Initially metformin caused diarrhea but resolved. Will titrate up metformin which patient agrees. Declined starting GLP-1 since she does not want to start injections at this time even though counseled her on potential weight loss benefits with GLP-1. Foot exam completed today. Unable to void today, defer urine microalbumin to next OV.   Plan -Increase metformin to 500 mg BID then continue titrate up as tolerated -Continue Jardiance 10 mg  -Referral to ophthalmology  -Repeat A1c in 2.5 months  -Check urine microalbumin at next visit   History of CVA (cerebrovascular accident) Hospitalized for subacute left temporal infarct. Found on TTE that EF reduced to 20-25%. No findings of thrombi on TEE. Patient discharged on Eliquis 5 mg BID. Tolerating well with daily adherence. She still has cardiac monitor in place. States weakness has improved. Neuro exam showed 5/5 strength in all extremities and no focal deficits. Will continue with risk stratifications. Has [facility] appointment in August.   Plan -Continue  Eliquis 5 mg BID -F/u [facility] in August  -F/u cardiac monitor results, has f/u with cardiology later this month  HLD (hyperlipidemia) LDL above goal 171. Patient is now taking atorvastatin 40 mg daily, tolerating well and daily adherence. Will repeat lipid panel at next visit.   Plan -Continue atorvastatin 40 mg  -Repeat lipid panel at next OV  HFrEF (heart failure with reduced ejection fraction) (HCC) EF of 20-25%. Cardiac MRI showed EF 19%. Discharged on GDMT including Jardiance 10 mg, Toprol 50 mg daily and spironolactone 12.5 mg daily (taking 25 mg daily). Does not appear volume overloaded today. BP stable at 122/81. Will continue with current GDMT and has cardiology f/u later this month.   Plan -Continue Jardiance 10 mg, Toprol 50 mg and Spironolactone 25 mg  -F/u with cardiology later this month   Obesity, Class III, BMI 40-49.9 (morbid obesity) (HCC) BMI 41.35. States working on dietary modifications  given diagnosis of T2DM. Counseled her on lifestyle modifications which she will try. Declined starting GLP-1 at this time, we can discuss at future visits if she is interested.   Healthcare maintenance Patient declined pap smear and referral for colonoscopy at this time. Will reassess at next visit.   Microcytic anemia Last CBC during hospitalization showed barely microcytic anemia with Hgb 11.6 and MCV 79.9. Denies any signs of bleeding. Will recheck CBC today.    Patient discussed with Dr. Rondall Allegra, D.O. [facility] Internal Medicine, PGY-2 Phone: [phone] Date 01/13/2023 Time 8:46 PM

## 2023-01-13 NOTE — Assessment & Plan Note (Signed)
BP on repeat improved to normal 122/81 from 146/115. She is taking Toprol 50 mg daily, spironolactone 25 mg daily (not 12.5 mg daily) and Jardiance 10 mg daily. Tolerating all well without side effects and reports daily adherence. Asymptomatic today. Will continue current meds and check electrolytes and kidney function today.   Plan -Continue Toprol 50 mg, spironolactone 25 mg and Jardiance 10 mg -BMP today

## 2023-01-14 LAB — BMP8+ANION GAP

## 2023-01-14 LAB — CBC
Hematocrit: 44.2 % (ref 34.0–46.6)
Hemoglobin: 14 g/dL (ref 11.1–15.9)
MCH: 25.2 pg — ABNORMAL LOW (ref 26.6–33.0)
MCV: 80 fL (ref 79–97)
RBC: 5.56 x10E6/uL — ABNORMAL HIGH (ref 3.77–5.28)

## 2023-01-15 ENCOUNTER — Encounter: Payer: Self-pay | Admitting: Neurology

## 2023-01-15 ENCOUNTER — Telehealth: Payer: Self-pay

## 2023-01-15 NOTE — Telephone Encounter (Signed)
   Cardiac Monitor Alert  Date of alert:  01/15/2023   Patient Name: Melody Robles  DOB: [birth year]  MRN: [id number]   [facility] Cardiologist: Christell Constant, MD  [facility] EP:  None    Monitor Information: Cardiac Event Monitor [[employer]]  Reason:  Cryptogenic stroke, HFrEF, PVC, NSVT Ordering provider:  Nada Boozer, NP   Alert Ventricular Tachycardia This is the 1st alert for this rhythm.   Next Cardiology Appointment   Date:  02/10/23  Provider:  Lissa Hoard, NP [facility] 7/9  The patient was contacted today.  She is asymptomatic. Arrhythmia, symptoms and history reviewed with Dr. Shari Prows DOD.   Plan:  Continue to monitor   Other:   Macie Burows, RN  01/15/2023 10:43 AM

## 2023-01-16 LAB — BMP8+ANION GAP
Anion Gap: 17 mmol/L (ref 10.0–18.0)
BUN/Creatinine Ratio: 12 (ref 9–23)
BUN: 16 mg/dL (ref 6–24)
CO2: 19 mmol/L — ABNORMAL LOW (ref 20–29)
Chloride: 105 mmol/L (ref 96–106)
Potassium: 4.9 mmol/L (ref 3.5–5.2)

## 2023-01-16 LAB — CBC
MCHC: 31.7 g/dL (ref 31.5–35.7)
Platelets: 383 10*3/uL (ref 150–450)
RDW: 14.4 % (ref 11.7–15.4)
WBC: 6.8 10*3/uL (ref 3.4–10.8)

## 2023-01-18 ENCOUNTER — Telehealth: Payer: Self-pay

## 2023-01-18 ENCOUNTER — Telehealth (HOSPITAL_COMMUNITY): Payer: Self-pay

## 2023-01-18 DIAGNOSIS — I639 Cerebral infarction, unspecified: Secondary | ICD-10-CM

## 2023-01-18 DIAGNOSIS — I5020 Unspecified systolic (congestive) heart failure: Secondary | ICD-10-CM

## 2023-01-18 DIAGNOSIS — Z79899 Other long term (current) drug therapy: Secondary | ICD-10-CM

## 2023-01-18 NOTE — Telephone Encounter (Signed)
   Cardiac Monitor Alert  Date of alert:  01/18/2023   Patient Name: Melody Robles  DOB: 09/22/1970  MRN: [id number]   [facility] Cardiologist: Christell Constant, MD  [facility] EP:  None    Monitor Information: Cardiac Event Monitor [[employer]]  Reason:  Cerebral infarction Ordering provider:  Nada Boozer, NP   Alert Sinus tachycardia, Sinus Rhythm w/Run of V-Tach ( 12 beats) PVC's ( 2 in 1 min). This is the 2nd alert for this rhythm.   Next Cardiology Appointment   Date:  02/10/23  Provider:  Eligha Bridegroom, NP  Patient stated [place] and believes her heart issues are related to her environment. Patient stated the during the time of her event there was some usual activity in her neighborhood during that time. Currently asymptomatic. Patient has scheduled lab appointment for 01/19/23. Pt has scheduled appointment with [facility] on 01/19/2023. Arrhythmia, symptoms and history reviewed with Dr. Mayford Knife DOD.    Plan: Magnesium/ BMET today, notify Dr. Izora Ribas, MD  Other: Continue to monitor.   Asencion Gowda, LPN  4/0/9811 91:47 AM

## 2023-01-18 NOTE — Telephone Encounter (Signed)
Called to confirm Heart & Vascular Transitions of Care appointment. Patient reminded to bring all medications and pill box organizer with them. Confirmed patient has transportation. Gave directions, instructed to utilize valet parking.  Confirmed appointment prior to ending call.   

## 2023-01-19 ENCOUNTER — Encounter (HOSPITAL_COMMUNITY): Payer: Self-pay

## 2023-01-19 ENCOUNTER — Ambulatory Visit
Admit: 2023-01-19 | Discharge: 2023-01-19 | Disposition: A | Discharge status: Home / Self Care | Payer: 59 | Attending: Cardiology | Admitting: Cardiology

## 2023-01-19 ENCOUNTER — Ambulatory Visit: Payer: 59

## 2023-01-19 VITALS — BP 142/102 | HR 98 | Wt 252.8 lb

## 2023-01-19 DIAGNOSIS — I472 Ventricular tachycardia, unspecified: Secondary | ICD-10-CM | POA: Diagnosis not present

## 2023-01-19 DIAGNOSIS — I110 Hypertensive heart disease with heart failure: Secondary | ICD-10-CM | POA: Insufficient documentation

## 2023-01-19 DIAGNOSIS — F1010 Alcohol abuse, uncomplicated: Secondary | ICD-10-CM | POA: Insufficient documentation

## 2023-01-19 DIAGNOSIS — Z7984 Long term (current) use of oral hypoglycemic drugs: Secondary | ICD-10-CM | POA: Diagnosis not present

## 2023-01-19 DIAGNOSIS — Z79899 Other long term (current) drug therapy: Secondary | ICD-10-CM | POA: Insufficient documentation

## 2023-01-19 DIAGNOSIS — Z7901 Long term (current) use of anticoagulants: Secondary | ICD-10-CM | POA: Insufficient documentation

## 2023-01-19 DIAGNOSIS — E669 Obesity, unspecified: Secondary | ICD-10-CM | POA: Diagnosis not present

## 2023-01-19 DIAGNOSIS — I5020 Unspecified systolic (congestive) heart failure: Secondary | ICD-10-CM | POA: Diagnosis not present

## 2023-01-19 DIAGNOSIS — E119 Type 2 diabetes mellitus without complications: Secondary | ICD-10-CM | POA: Insufficient documentation

## 2023-01-19 DIAGNOSIS — G4733 Obstructive sleep apnea (adult) (pediatric): Secondary | ICD-10-CM | POA: Insufficient documentation

## 2023-01-19 DIAGNOSIS — I081 Rheumatic disorders of both mitral and tricuspid valves: Secondary | ICD-10-CM | POA: Diagnosis not present

## 2023-01-19 DIAGNOSIS — I4729 Other ventricular tachycardia: Secondary | ICD-10-CM

## 2023-01-19 DIAGNOSIS — I5022 Chronic systolic (congestive) heart failure: Secondary | ICD-10-CM | POA: Insufficient documentation

## 2023-01-19 DIAGNOSIS — Z8673 Personal history of transient ischemic attack (TIA), and cerebral infarction without residual deficits: Secondary | ICD-10-CM | POA: Diagnosis not present

## 2023-01-19 DIAGNOSIS — E785 Hyperlipidemia, unspecified: Secondary | ICD-10-CM | POA: Diagnosis not present

## 2023-01-19 DIAGNOSIS — I428 Other cardiomyopathies: Secondary | ICD-10-CM | POA: Insufficient documentation

## 2023-01-19 DIAGNOSIS — F17210 Nicotine dependence, cigarettes, uncomplicated: Secondary | ICD-10-CM | POA: Insufficient documentation

## 2023-01-19 LAB — BASIC METABOLIC PANEL
Anion gap: 12 (ref 5–15)
BUN: 21 mg/dL — ABNORMAL HIGH (ref 6–20)
CO2: 22 mmol/L (ref 22–32)
Calcium: 9.5 mg/dL (ref 8.9–10.3)
Chloride: 105 mmol/L (ref 98–111)
Creatinine, Ser: 1.3 mg/dL — ABNORMAL HIGH (ref 0.44–1.00)
GFR, Estimated: 50 mL/min — ABNORMAL LOW (ref >60)
Glucose, Bld: 92 mg/dL (ref 70–99)
Potassium: 4.3 mmol/L (ref 3.5–5.1)
Sodium: 139 mmol/L (ref 135–145)

## 2023-01-19 LAB — MAGNESIUM: Magnesium: 2.1 mg/dL (ref 1.7–2.4)

## 2023-01-19 LAB — BRAIN NATRIURETIC PEPTIDE: B Natriuretic Peptide: 212.3 pg/mL — ABNORMAL HIGH (ref 0.0–100.0)

## 2023-01-19 MED ORDER — ENTRESTO 24-26 MG PO TABS: 1.0000 | ORAL_TABLET | Freq: Two times a day (BID) | ORAL | 3 refills | Status: DC
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
## 2023-01-19 NOTE — Progress Notes (Signed)
Blood collected for TTR genetic testing per Dr Aquilla Solian, PA.  Order form completed and both shipped by Fed[employer] to [facility].

## 2023-01-19 NOTE — Patient Instructions (Signed)
Medication Changes:  START Entresto 24/26 mg Twice daily   Lab Work:  Labs done today, we will call you for abnormal results  Testing/Procedures:  Genetic test has been done, this has to be sent to [place] to be processed and can take 1-2 weeks to get results back.  We will let you know the results.  Referrals:  You have been referred to [facility] for a LifeVest, they will call you and schedule a home visit to fit you for this  Special Instructions // Education:  Do the following things EVERYDAY: Weigh yourself in the morning before breakfast. Write it down and keep it in a log. Take your medicines as prescribed Eat low salt foods--Limit salt (sodium) to 2000 mg per day.  Stay as active as you can everyday Limit all fluids for the day to less than 2 liters   Follow-Up in:   Please follow up with our heart failure pharmacist in 2-3 weeks  Thank you for allowing [place] to provider your heart failure care after your recent hospitalization. Please follow-up with Dr Gala Romney in our Advanced Heart Failure Clinic in 4-6 weeks     At the Advanced Heart Failure Clinic, you and your health needs are our priority. We have a designated team specialized in the treatment of Heart Failure. This Care Team includes your primary Heart Failure Specialized Cardiologist (physician), Advanced Practice Providers (APPs- Physician Assistants and Nurse Practitioners), and Pharmacist who all work together to provide you with the care you need, when you need it.   You may see any of the following providers on your designated Care Team at your next follow up:  Dr. Arvilla Meres Dr. Marca Ancona Dr. Marcos Eke, NP Robbie Lis, [place] [facility] [place], [place] Brynda Peon, NP Karle Plumber, PharmD   Please be sure to bring in all your medications bottles to every appointment.   Need to Contact us:  If you have any questions or concerns before your next appointment please  send [place] a message through [place] or call our office at [phone].    TO LEAVE A MESSAGE FOR THE NURSE SELECT OPTION 2, PLEASE LEAVE A MESSAGE INCLUDING: YOUR NAME DATE OF BIRTH CALL BACK NUMBER REASON FOR CALL**this is important as we prioritize the call backs  YOU WILL RECEIVE A CALL BACK THE SAME DAY AS LONG AS YOU CALL BEFORE 4:00 PM

## 2023-01-19 NOTE — Addendum Note (Signed)
Encounter addended by: Noralee Space, RN on: 01/19/2023 12:48 PM  Actions taken: Clinical Note Signed

## 2023-01-19 NOTE — Progress Notes (Signed)
Internal Medicine Clinic Attending  Case discussed with the resident at the time of the visit.  We reviewed the resident's history and exam and pertinent patient test results.  I agree with the assessment, diagnosis, and plan of care documented in the resident's note.  

## 2023-01-19 NOTE — Progress Notes (Signed)
Lifevest order, demographics, and cmri results all faxed into [facility]

## 2023-01-19 NOTE — Addendum Note (Signed)
Encounter addended by: Marcy Siren, LCSW on: 01/19/2023 2:34 PM  Actions taken: Flowsheet accepted, Clinical Note Signed

## 2023-01-21 ENCOUNTER — Telehealth: Payer: Self-pay | Admitting: Internal Medicine

## 2023-01-21 NOTE — Telephone Encounter (Signed)
Received monitor alert from [facility] from [employer].  Reporting critical EKG alert: At 3:35 PM CST today patient had a run of v-tach for 14 beats, with maximum heart rate of 171 bpm. Returned to sinus rhythm  with heart rate of 92 bpm, with a couple of PVCs and 4 multifocal PVCs.  Burnard Leigh reports patient was contacted by their company and he did not complain of any symptoms.  I called and spoke with patient myself and she reports she was in bed at that time, and may have gotten up to put a shirt on. Denies any SOB, lightheadedness, CP, or palpitations. She states "I'm doing just fine."  DOD for today has already left. Waiting for monitor report, will review with DOD when received.

## 2023-01-21 NOTE — Telephone Encounter (Signed)
Nahidah from [employer] is calling with critical results.

## 2023-01-22 NOTE — Telephone Encounter (Signed)
   Cardiac Monitor Alert  Date of alert:  01/22/2023   Patient Name: Melody Robles  DOB: [birth year]  MRN: [id number]   [facility] Cardiologist: Christell Constant, MD  [facility] EP:  None    Monitor Information: Cardiac Event Monitor [[employer]]  Reason:  Cryptogenic stroke, heart failure with reduced ejection fraction, PVC's, NSVT Ordering provider:  Riley Lam, MD   Alert Ventricular Tachycardia This is the 2nd alert for this rhythm.   Next Cardiology Appointment  01/26/23 at 8:30 am with Dr. Nelly Laurence   The patient was contacted yesterday afternoon by Danford Bad.  She is asymptomatic. Arrhythmia, symptoms and history reviewed with Dr. Roby Lofts   Plan:  See a provider next week    Richelle Ito, RN  01/22/2023 8:36 AM

## 2023-01-22 NOTE — Telephone Encounter (Signed)
Made appointment with Dr. Nelly Laurence for 7/16 at 8:30am per Dr. Lynnette Caffey (DOD) request after Monitor alert.

## 2023-01-26 ENCOUNTER — Ambulatory Visit: Payer: 59 | Attending: Cardiovascular Disease | Admitting: Cardiovascular Disease

## 2023-01-26 ENCOUNTER — Encounter: Payer: Self-pay | Admitting: Cardiovascular Disease

## 2023-01-26 VITALS — BP 120/76 | HR 94 | Ht 66.0 in | Wt 252.0 lb

## 2023-01-26 DIAGNOSIS — I4729 Other ventricular tachycardia: Secondary | ICD-10-CM

## 2023-01-26 DIAGNOSIS — I493 Ventricular premature depolarization: Secondary | ICD-10-CM | POA: Diagnosis not present

## 2023-01-26 NOTE — Patient Instructions (Signed)
Medication Instructions:  Your physician recommends that you continue on your current medications as directed. Please refer to the Current Medication list given to you today. *If you need a refill on your cardiac medications before your next appointment, please call your pharmacy*   Testing/Procedures: ICD implant - Tuesday, August 20th  Your physician has recommended that you have a defibrillator inserted. An implantable cardioverter defibrillator (ICD) is a small device that is placed in your chest or, in rare cases, your abdomen. This device uses electrical pulses or shocks to help control life-threatening, irregular heartbeats that could lead the heart to suddenly stop beating (sudden cardiac arrest). Leads are attached to the ICD that goes into your heart. This is done in the hospital and usually requires an overnight stay. Please see the instruction sheet given to you today for more information.   Follow-Up: At [facility], you and your health needs are our priority.  As part of our continuing mission to provide you with exceptional heart care, we have created designated Provider Care Teams.  These Care Teams include your primary Cardiologist (physician) and Advanced Practice Providers (APPs -  Physician Assistants and Nurse Practitioners) who all work together to provide you with the care you need, when you need it.  We recommend signing up for the patient portal called "MyChart".  Sign up information is provided on this After Visit Summary.  MyChart is used to connect with patients for Virtual Visits (Telemedicine).  Patients are able to view lab/test results, encounter notes, upcoming appointments, etc.  Non-urgent messages can be sent to your provider as well.   To learn more about what you can do with MyChart, go to [email or web address].    Your next appointment:   We will contact you to schedule follow up after ICD implant  Provider:   York Pellant, MD
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
## 2023-01-27 NOTE — Progress Notes (Signed)
reviewed

## 2023-02-02 ENCOUNTER — Encounter (HOSPITAL_COMMUNITY): Payer: 59
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
## 2023-02-03 ENCOUNTER — Encounter (HOSPITAL_COMMUNITY): Payer: Self-pay

## 2023-02-03 ENCOUNTER — Other Ambulatory Visit (HOSPITAL_COMMUNITY): Payer: Self-pay

## 2023-02-03 ENCOUNTER — Telehealth: Payer: Self-pay | Admitting: *Deleted

## 2023-02-03 ENCOUNTER — Ambulatory Visit (HOSPITAL_COMMUNITY)
Admission: RE | Admit: 2023-02-03 | Discharge: 2023-02-03 | Disposition: A | Discharge status: Home / Self Care | Payer: 59 | Source: Ambulatory Visit | Attending: Family Medicine | Admitting: Family Medicine

## 2023-02-03 ENCOUNTER — Telehealth (HOSPITAL_COMMUNITY): Payer: Self-pay

## 2023-02-03 VITALS — BP 108/84 | HR 86 | Wt 254.8 lb

## 2023-02-03 DIAGNOSIS — Z7901 Long term (current) use of anticoagulants: Secondary | ICD-10-CM | POA: Insufficient documentation

## 2023-02-03 DIAGNOSIS — Z79899 Other long term (current) drug therapy: Secondary | ICD-10-CM | POA: Insufficient documentation

## 2023-02-03 DIAGNOSIS — I4729 Other ventricular tachycardia: Secondary | ICD-10-CM

## 2023-02-03 DIAGNOSIS — I110 Hypertensive heart disease with heart failure: Secondary | ICD-10-CM | POA: Insufficient documentation

## 2023-02-03 DIAGNOSIS — E785 Hyperlipidemia, unspecified: Secondary | ICD-10-CM | POA: Insufficient documentation

## 2023-02-03 DIAGNOSIS — E669 Obesity, unspecified: Secondary | ICD-10-CM | POA: Diagnosis not present

## 2023-02-03 DIAGNOSIS — Z8673 Personal history of transient ischemic attack (TIA), and cerebral infarction without residual deficits: Secondary | ICD-10-CM | POA: Insufficient documentation

## 2023-02-03 DIAGNOSIS — Z87891 Personal history of nicotine dependence: Secondary | ICD-10-CM | POA: Insufficient documentation

## 2023-02-03 DIAGNOSIS — I428 Other cardiomyopathies: Secondary | ICD-10-CM | POA: Diagnosis not present

## 2023-02-03 DIAGNOSIS — R0683 Snoring: Secondary | ICD-10-CM | POA: Diagnosis not present

## 2023-02-03 DIAGNOSIS — I340 Nonrheumatic mitral (valve) insufficiency: Secondary | ICD-10-CM | POA: Diagnosis not present

## 2023-02-03 DIAGNOSIS — I472 Ventricular tachycardia, unspecified: Secondary | ICD-10-CM | POA: Diagnosis not present

## 2023-02-03 DIAGNOSIS — I639 Cerebral infarction, unspecified: Secondary | ICD-10-CM

## 2023-02-03 DIAGNOSIS — E119 Type 2 diabetes mellitus without complications: Secondary | ICD-10-CM | POA: Diagnosis not present

## 2023-02-03 DIAGNOSIS — Z7984 Long term (current) use of oral hypoglycemic drugs: Secondary | ICD-10-CM | POA: Insufficient documentation

## 2023-02-03 DIAGNOSIS — I5022 Chronic systolic (congestive) heart failure: Secondary | ICD-10-CM | POA: Diagnosis present

## 2023-02-03 DIAGNOSIS — I5020 Unspecified systolic (congestive) heart failure: Secondary | ICD-10-CM

## 2023-02-03 DIAGNOSIS — I10 Essential (primary) hypertension: Secondary | ICD-10-CM

## 2023-02-03 DIAGNOSIS — Z6841 Body Mass Index (BMI) 40.0 and over, adult: Secondary | ICD-10-CM | POA: Diagnosis not present

## 2023-02-03 LAB — CBC
HCT: 41 % (ref 36.0–46.0)
Hemoglobin: 12.8 g/dL (ref 12.0–15.0)
MCH: 24.5 pg — ABNORMAL LOW (ref 26.0–34.0)
MCHC: 31.2 g/dL (ref 30.0–36.0)
MCV: 78.4 fL — ABNORMAL LOW (ref 80.0–100.0)
Platelets: 338 10*3/uL (ref 150–400)
RBC: 5.23 MIL/uL — ABNORMAL HIGH (ref 3.87–5.11)
RDW: 14.2 % (ref 11.5–15.5)
WBC: 6.5 10*3/uL (ref 4.0–10.5)
nRBC: 0 % (ref 0.0–0.2)

## 2023-02-03 LAB — BASIC METABOLIC PANEL
Anion gap: 5 (ref 5–15)
BUN: 15 mg/dL (ref 6–20)
CO2: 22 mmol/L (ref 22–32)
Calcium: 8.8 mg/dL — ABNORMAL LOW (ref 8.9–10.3)
Chloride: 108 mmol/L (ref 98–111)
Creatinine, Ser: 1.11 mg/dL — ABNORMAL HIGH (ref 0.44–1.00)
GFR, Estimated: >60 mL/min (ref >60)
Glucose, Bld: 97 mg/dL (ref 70–99)
Potassium: 3.8 mmol/L (ref 3.5–5.1)
Sodium: 135 mmol/L (ref 135–145)

## 2023-02-03 MED ORDER — IVABRADINE HCL 5 MG PO TABS: 2.5000 mg | ORAL_TABLET | Freq: Two times a day (BID) | ORAL | 5 refills | Status: DC

## 2023-02-03 NOTE — Telephone Encounter (Signed)
Advanced Heart Failure Patient Advocate Encounter  Prior authorization for [facility] has been submitted and approved. Test billing returns $0 for 30 day supply.  [facility] Herter Effective: 02/03/2023 to 07/13/2023  Burnell Blanks, CPhT Rx Patient Advocate Phone: [phone]

## 2023-02-03 NOTE — Progress Notes (Signed)
Height:     Weight: BMI:  Today's Date:  STOP BANG RISK ASSESSMENT S (snore) Have you been told that you snore?     YES   T (tired) Are you often tired, fatigued, or sleepy during the day?   YES  O (obstruction) Do you stop breathing, choke, or gasp during sleep? NO   P (pressure) Do you have or are you being treated for high blood pressure? YES   B (BMI) Is your body index greater than 35 kg/m? YES   A (age) Are you 50 years old or older? NO   N (neck) Do you have a neck circumference greater than 16 inches?   NO   G (gender) Are you a female? NO   TOTAL STOP/BANG "YES" ANSWERS 4                                                                       For Office Use Only              Procedure Order Form    YES to 3+ Stop Bang questions OR two clinical symptoms - patient qualifies for WatchPAT (CPT 95800)      Clinical Notes: Will consult Sleep Specialist and refer for management of therapy due to patient increased risk of Sleep Apnea. Ordering a sleep study due to the following two clinical symptoms: Excessive daytime sleepiness G47.10 / Gastroesophageal reflux K21.9 / Nocturia R35.1 / Morning Headaches G44.221 / Difficulty concentrating R41.840 / Memory problems or poor judgment G31.84 / Personality changes or irritability R45.4 / Loud snoring R06.83 / Depression F32.9 / Unrefreshed by sleep G47.8 / Impotence N52.9 / History of high blood pressure R03.0 / Insomnia G47.00

## 2023-02-03 NOTE — Telephone Encounter (Signed)
-----   Message from Melody Robles sent at 02/03/2023  4:11 PM EDT ----- Patient has appointment with me on 7/31 which can be cancelled. She needs to continue to follow-up with the Advanced Heart Failure team as scheduled.  Thanks! Marcelino Duster

## 2023-02-03 NOTE — Telephone Encounter (Signed)
S/w pt, Canceled upcoming appt with Eligha Bridegroom, NP. Pt is followed in CHF clinic.

## 2023-02-03 NOTE — Patient Instructions (Addendum)
Medication Changes:  START: IVABRADINE 2.5MG  TWICE DAILY   Lab Work:  Labs done today, your results will be available in MyChart, we will contact you for abnormal readings.  Testing/Procedures:  Your provider has recommended that you have a home sleep study (Itamar Test).  We have provided you with the equipment in our office today. Please go ahead and download the app. DO NOT OPEN OR TAMPER WITH THE BOX UNTIL WE ADVISE YOU TO DO SO. Once insurance has approved the test our office will call you with PIN number and approval to proceed with testing. Once you have completed the test you just dispose of the equipment, the information is automatically uploaded to [place] via blue-tooth technology. If your test is positive for sleep apnea and you need a home CPAP machine you will be contacted by Dr Norris Cross office [facility]) to set this up.  Referrals:  REFERRAL TO DR. Jomarie Longs- GENETICS SOMEONE WILL CALL TO GET YOU SCHEDULED    Follow-Up in: 2 WEEKS WITH PHARMD AS SCHEDULED   AND THEN KEEP FOLLOW UP WITH DR. Gala Romney ON 03/05/23  At the Advanced Heart Failure Clinic, you and your health needs are our priority. We have a designated team specialized in the treatment of Heart Failure. This Care Team includes your primary Heart Failure Specialized Cardiologist (physician), Advanced Practice Providers (APPs- Physician Assistants and Nurse Practitioners), and Pharmacist who all work together to provide you with the care you need, when you need it.   You may see any of the following providers on your designated Care Team at your next follow up:  Dr. Arvilla Meres Dr. Marca Ancona Dr. Marcos Eke, NP Robbie Lis, [place] [facility] [place], [place] Brynda Peon, NP Karle Plumber, PharmD   Please be sure to bring in all your medications bottles to every appointment.   Need to Contact us:  If you have any questions or concerns before your next appointment please send  [place] a message through [place] or call our office at [phone].    TO LEAVE A MESSAGE FOR THE NURSE SELECT OPTION 2, PLEASE LEAVE A MESSAGE INCLUDING: YOUR NAME DATE OF BIRTH CALL BACK NUMBER REASON FOR CALL**this is important as we prioritize the call backs  YOU WILL RECEIVE A CALL BACK THE SAME DAY AS LONG AS YOU CALL BEFORE 4:00 PM

## 2023-02-04 ENCOUNTER — Telehealth: Payer: Self-pay | Admitting: Internal Medicine

## 2023-02-04 NOTE — Telephone Encounter (Signed)
Caller reporting critical EKG results

## 2023-02-04 NOTE — Telephone Encounter (Signed)
We do not follow this patient in the device clinic. Thank you.

## 2023-02-04 NOTE — Telephone Encounter (Signed)
Received call from [employer] regarding notification of pt's monitor results demonstrating 10 beats of VT, underlining rhythm NSR with rate of 82, also 3 PVCs noted.  This occurred ay 7:16 am CT.  Pt was asymptomatic per report.  Will forward to device clinic.

## 2023-02-04 NOTE — Telephone Encounter (Signed)
Monitor reported reviewed and signed off on by Dr Nelly Laurence.  Pt is scheduled for ICD placement 03/02/23.  No new orders at this time.

## 2023-02-05 ENCOUNTER — Encounter: Payer: Self-pay | Admitting: Internal Medicine

## 2023-02-09 ENCOUNTER — Ambulatory Visit: Payer: 59 | Attending: Cardiology

## 2023-02-09 DIAGNOSIS — I5020 Unspecified systolic (congestive) heart failure: Secondary | ICD-10-CM

## 2023-02-09 DIAGNOSIS — I639 Cerebral infarction, unspecified: Secondary | ICD-10-CM

## 2023-02-09 DIAGNOSIS — I4729 Other ventricular tachycardia: Secondary | ICD-10-CM

## 2023-02-09 DIAGNOSIS — I493 Ventricular premature depolarization: Secondary | ICD-10-CM

## 2023-02-10 ENCOUNTER — Ambulatory Visit: Payer: 59 | Admitting: Nurse Practitioner

## 2023-02-11 ENCOUNTER — Other Ambulatory Visit (HOSPITAL_COMMUNITY): Payer: 59

## 2023-02-11 ENCOUNTER — Telehealth (HOSPITAL_COMMUNITY): Payer: Self-pay | Admitting: Licensed Clinical Social Worker

## 2023-02-11 NOTE — Telephone Encounter (Signed)
H&V Care Navigation CSW Progress Note  Clinical Social Worker received call from pt requesting help with paying for her current rent.  Pt trying to move into a new apartment but can't pay for current rent and rent/first month deposit at new apartment site without assistance.  Pt will gather required documents and provide to CSW.  CSW attempted to reach pt apartment management company to get required documents from them- left VM.   SDOH Screenings   Food Insecurity: No Food Insecurity (01/13/2023)  Housing: Low Risk  (01/19/2023)  Transportation Needs: No Transportation Needs (01/13/2023)  Utilities: Not At Risk (01/13/2023)  Alcohol Screen: Low Risk  (01/13/2023)  Depression (PHQ2-9): Low Risk  (01/13/2023)  Financial Resource Strain: Low Risk  (01/13/2023)  Physical Activity: Inactive (01/13/2023)  Social Connections: Moderately Isolated (01/13/2023)  Stress: No Stress Concern Present (01/13/2023)  Tobacco Use: Medium Risk (02/03/2023)    Burna Sis, LCSW Clinical Social Worker Advanced [facility] Failure Clinic Desk#: [phone] Cell#: [phone]

## 2023-02-15 ENCOUNTER — Other Ambulatory Visit (HOSPITAL_COMMUNITY): Payer: Self-pay | Admitting: Internal Medicine

## 2023-02-16 ENCOUNTER — Encounter: Payer: Self-pay | Admitting: Cardiology

## 2023-02-16 ENCOUNTER — Ambulatory Visit: Payer: 59 | Attending: Internal Medicine

## 2023-02-16 DIAGNOSIS — I493 Ventricular premature depolarization: Secondary | ICD-10-CM

## 2023-02-16 DIAGNOSIS — I4729 Other ventricular tachycardia: Secondary | ICD-10-CM

## 2023-02-16 LAB — BASIC METABOLIC PANEL
BUN/Creatinine Ratio: 13 (ref 9–23)
BUN: 14 mg/dL (ref 6–24)
CO2: 22 mmol/L (ref 20–29)
Calcium: 9.4 mg/dL (ref 8.7–10.2)
Chloride: 106 mmol/L (ref 96–106)
Creatinine, Ser: 1.12 mg/dL — ABNORMAL HIGH (ref 0.57–1.00)
Glucose: 84 mg/dL (ref 70–99)
Potassium: 4 mmol/L (ref 3.5–5.2)
Sodium: 142 mmol/L (ref 134–144)
eGFR: 60 mL/min/{1.73_m2} (ref >59)

## 2023-02-16 LAB — CBC
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
## 2023-02-17 ENCOUNTER — Inpatient Hospital Stay (HOSPITAL_COMMUNITY): Admission: RE | Admit: 2023-02-17 | Payer: 59 | Source: Ambulatory Visit
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
## 2023-02-22 ENCOUNTER — Encounter: Payer: Self-pay | Admitting: Family Medicine

## 2023-02-22 ENCOUNTER — Ambulatory Visit (INDEPENDENT_AMBULATORY_CARE_PROVIDER_SITE_OTHER): Payer: 59 | Admitting: Family Medicine

## 2023-02-22 VITALS — BP 119/89 | HR 66 | Ht 66.0 in | Wt 254.0 lb

## 2023-02-22 DIAGNOSIS — I5020 Unspecified systolic (congestive) heart failure: Secondary | ICD-10-CM | POA: Diagnosis not present

## 2023-02-22 DIAGNOSIS — I10 Essential (primary) hypertension: Secondary | ICD-10-CM | POA: Diagnosis not present

## 2023-02-22 DIAGNOSIS — Z7985 Long-term (current) use of injectable non-insulin antidiabetic drugs: Secondary | ICD-10-CM

## 2023-02-22 DIAGNOSIS — Z8673 Personal history of transient ischemic attack (TIA), and cerebral infarction without residual deficits: Secondary | ICD-10-CM

## 2023-02-22 DIAGNOSIS — E782 Mixed hyperlipidemia: Secondary | ICD-10-CM | POA: Diagnosis not present

## 2023-02-22 DIAGNOSIS — E1169 Type 2 diabetes mellitus with other specified complication: Secondary | ICD-10-CM

## 2023-02-22 NOTE — Patient Instructions (Addendum)
Below is our plan:  Subacute stroke: left temporal lobe subacute infarct, etiology unclear, concerning for cardioembolic source given location and low EF: Residual deficit: none. Continue Eliquis (apixaban) daily and atorvastatin 40mg  daily for secondary stroke prevention. Could consider switching to antiplatelet therapy if EF recovers. Could consider referral to hematology for hypercoagulation panel once Eliquis discontinued. Discussed secondary stroke prevention measures and importance of close PCP follow up for aggressive stroke risk factor management. I have gone over the pathophysiology of stroke, warning signs and symptoms, risk factors and their management in some detail with instructions to go to the closest emergency room for symptoms of concern. HTN: BP goal <130/90.  Stable on metoprolol and spironolactone per PCP HLD: LDL goal <70. Recent LDL 171. Continue atorvastatin per PCP.  DMII: A1c goal<7.0. Recent A1c 8. Continue metformin and Jardiance per PCP.  Cardiomyopathy/CHF: continue close follow up with cardiology. Continue Eliquis and Entresto twice daily. May switch to antiplatelet therapy if EF recovers. Continue plans for sleep evaluation.  Tobacco use: continued cessation   Goals:  1) Maintain strict control of hypertension with blood pressure goal below 130/90 2) Maintain good control of diabetes with hemoglobin A1c goal below 7%  3) Maintain good control of lipids with LDL cholesterol goal below 70 mg/dL.  4) Eat a healthy diet with plenty of whole grains, cereals, fruits and vegetables, exercise regularly and maintain ideal body weight   Resources: [email or web address].biz/  Please make sure you are staying well hydrated. I recommend 50-60 ounces daily. Well balanced diet and regular exercise encouraged. Consistent sleep schedule with 6-8 hours recommended.   Please continue follow up with care team as directed.    Follow up with me in 6 months   You may receive a survey regarding today's visit. I encourage you to leave honest feed back as I do use this information to improve patient care. Thank you for seeing me today!

## 2023-02-25 ENCOUNTER — Telehealth (HOSPITAL_COMMUNITY): Payer: Self-pay | Admitting: Licensed Clinical Social Worker

## 2023-02-25 NOTE — Telephone Encounter (Signed)
H&V Care Navigation CSW Progress Note  Clinical Social Worker received call back from management company with [facility] that pt is trying to move to.  Pt owes $1,092 to a bond company- this payment must be made electronically and within 24 hours of now- patient care fund will be unable to assist with this.  Pt and pt dtr will then owe another approximately $1,200 to $1,300 within 10 days of paying the bond.    CSW spoke with management about our ability to assist with this payment.  Manager reports they would likely need a electronic payment as the no longer accept checks.  Reports that he is also unsure if they could provide [place] a w-9- manager will check on this.  CSW and pt will plan to touch base tomorrow to see if she can afford bond payment and whatever is leftover from $1,000 patient care fund payment.     SDOH Screenings   Food Insecurity: No Food Insecurity (01/13/2023)  Housing: Low Risk  (01/19/2023)  Transportation Needs: No Transportation Needs (01/13/2023)  Utilities: Not At Risk (01/13/2023)  Alcohol Screen: Low Risk  (01/13/2023)  Depression (PHQ2-9): Low Risk  (01/13/2023)  Financial Resource Strain: Low Risk  (01/13/2023)  Physical Activity: Inactive (01/13/2023)  Social Connections: Moderately Isolated (01/13/2023)  Stress: No Stress Concern Present (01/13/2023)  Tobacco Use: Medium Risk (02/22/2023)   Burna Sis, LCSW Clinical Social Worker [facility] Desk#: [phone] Cell#: [phone]

## 2023-02-25 NOTE — Telephone Encounter (Signed)
CSW working with pt to obtain necessary documents from management company for [place] to assist with rental or security deposit statement.  CSW awaiting return call from company to discuss details.  Burna Sis, LCSW Clinical Social Worker [facility] Desk#: [phone] Cell#: [phone]

## 2023-03-01 ENCOUNTER — Telehealth (HOSPITAL_COMMUNITY): Payer: Self-pay | Admitting: Licensed Clinical Social Worker

## 2023-03-01 NOTE — Pre-Procedure Instructions (Signed)
Attempted to call patient regarding procedure instructions.  Left voicemail on the following items: Arrival time 1130 Nothing to eat or drink after midnight No meds AM of procedure Responsible person to drive you home and stay with you for 24 hrs Wash with special soap night before and morning of procedure If on anti-coagulant drug instructions Eliquis- last dose 8/17.
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
## 2023-03-02 ENCOUNTER — Other Ambulatory Visit: Payer: Self-pay

## 2023-03-02 ENCOUNTER — Encounter (HOSPITAL_COMMUNITY)
Admission: RE | Disposition: A | Discharge status: Home / Self Care | Payer: Self-pay | Source: Home / Self Care | Attending: Cardiovascular Disease

## 2023-03-02 ENCOUNTER — Ambulatory Visit (HOSPITAL_COMMUNITY)
Admission: RE | Admit: 2023-03-02 | Discharge: 2023-03-02 | Disposition: A | Discharge status: Home / Self Care | Payer: 59 | Attending: Cardiovascular Disease | Admitting: Cardiovascular Disease

## 2023-03-02 ENCOUNTER — Ambulatory Visit (HOSPITAL_COMMUNITY): Payer: 59

## 2023-03-02 DIAGNOSIS — Z79899 Other long term (current) drug therapy: Secondary | ICD-10-CM | POA: Diagnosis not present

## 2023-03-02 DIAGNOSIS — R0683 Snoring: Secondary | ICD-10-CM | POA: Insufficient documentation

## 2023-03-02 DIAGNOSIS — Z8673 Personal history of transient ischemic attack (TIA), and cerebral infarction without residual deficits: Secondary | ICD-10-CM | POA: Diagnosis not present

## 2023-03-02 DIAGNOSIS — E119 Type 2 diabetes mellitus without complications: Secondary | ICD-10-CM | POA: Diagnosis not present

## 2023-03-02 DIAGNOSIS — Z87891 Personal history of nicotine dependence: Secondary | ICD-10-CM | POA: Diagnosis not present

## 2023-03-02 DIAGNOSIS — Z7984 Long term (current) use of oral hypoglycemic drugs: Secondary | ICD-10-CM | POA: Diagnosis not present

## 2023-03-02 DIAGNOSIS — I472 Ventricular tachycardia, unspecified: Secondary | ICD-10-CM | POA: Diagnosis not present

## 2023-03-02 DIAGNOSIS — Z7901 Long term (current) use of anticoagulants: Secondary | ICD-10-CM | POA: Diagnosis not present

## 2023-03-02 DIAGNOSIS — I110 Hypertensive heart disease with heart failure: Secondary | ICD-10-CM | POA: Insufficient documentation

## 2023-03-02 DIAGNOSIS — I429 Cardiomyopathy, unspecified: Secondary | ICD-10-CM

## 2023-03-02 DIAGNOSIS — I5022 Chronic systolic (congestive) heart failure: Secondary | ICD-10-CM | POA: Diagnosis not present

## 2023-03-02 DIAGNOSIS — Z8249 Family history of ischemic heart disease and other diseases of the circulatory system: Secondary | ICD-10-CM | POA: Insufficient documentation

## 2023-03-02 DIAGNOSIS — I340 Nonrheumatic mitral (valve) insufficiency: Secondary | ICD-10-CM | POA: Insufficient documentation

## 2023-03-02 DIAGNOSIS — I428 Other cardiomyopathies: Secondary | ICD-10-CM | POA: Diagnosis present

## 2023-03-02 HISTORY — PX: ICD IMPLANT: EP1208

## 2023-03-02 SURGERY — ICD IMPLANT

## 2023-03-02 MED ORDER — MIDAZOLAM HCL 5 MG/5ML IJ SOLN
INTRAMUSCULAR | Status: DC | PRN
  Administered 2023-03-02 (×2): 1 mg via INTRAVENOUS

## 2023-03-02 MED ORDER — CEFAZOLIN SODIUM-DEXTROSE 2-4 GM/100ML-% IV SOLN
INTRAVENOUS | Status: AC
  Filled 2023-03-02: qty 100

## 2023-03-02 MED ORDER — POVIDONE-IODINE 10 % EX SWAB
2.0000 | Freq: Once | CUTANEOUS | Status: AC
  Administered 2023-03-02: 2 via TOPICAL

## 2023-03-02 MED ORDER — ONDANSETRON HCL 4 MG/2ML IJ SOLN: 4.0000 mg | Freq: Four times a day (QID) | INTRAMUSCULAR | Status: DC | PRN

## 2023-03-02 MED ORDER — FENTANYL CITRATE (PF) 100 MCG/2ML IJ SOLN
INTRAMUSCULAR | Status: AC
  Filled 2023-03-02: qty 2

## 2023-03-02 MED ORDER — FENTANYL CITRATE (PF) 100 MCG/2ML IJ SOLN
INTRAMUSCULAR | Status: DC | PRN
  Administered 2023-03-02 (×2): 25 ug via INTRAVENOUS

## 2023-03-02 MED ORDER — SODIUM CHLORIDE 0.9 % IV SOLN
INTRAVENOUS | Status: AC
  Filled 2023-03-02: qty 2

## 2023-03-02 MED ORDER — MIDAZOLAM HCL 2 MG/2ML IJ SOLN
INTRAMUSCULAR | Status: AC
  Filled 2023-03-02: qty 2

## 2023-03-02 MED ORDER — LIDOCAINE HCL (PF) 1 % IJ SOLN
INTRAMUSCULAR | Status: DC | PRN
  Administered 2023-03-02: 60 mL via INTRADERMAL

## 2023-03-02 MED ORDER — SODIUM CHLORIDE 0.9 % IV SOLN: INTRAVENOUS | Status: DC

## 2023-03-02 MED ORDER — CEFAZOLIN SODIUM-DEXTROSE 2-4 GM/100ML-% IV SOLN
2.0000 g | INTRAVENOUS | Status: AC
  Administered 2023-03-02: 2 g via INTRAVENOUS

## 2023-03-02 MED ORDER — LIDOCAINE HCL (PF) 1 % IJ SOLN
INTRAMUSCULAR | Status: AC
  Filled 2023-03-02: qty 60

## 2023-03-02 MED ORDER — SODIUM CHLORIDE 0.9 % IV SOLN
80.0000 mg | INTRAVENOUS | Status: AC
  Administered 2023-03-02: 80 mg

## 2023-03-02 MED ORDER — ACETAMINOPHEN 325 MG PO TABS: 325.0000 mg | ORAL_TABLET | ORAL | Status: DC | PRN

## 2023-03-02 SURGICAL SUPPLY — 8 items
CABLE SURGICAL S-101-97-12 (CABLE) ×1 IMPLANT
ICD COBALT XT VR DVPA2D4 (ICD Generator) IMPLANT
KIT MICROPUNCTURE NIT STIFF (SHEATH) IMPLANT
LEAD SPRINT QUAT SEC 6935M-62 (Lead) IMPLANT
MAT PREVALON FULL STRYKER (MISCELLANEOUS) IMPLANT
PAD DEFIB RADIO PHYSIO CONN (PAD) ×1 IMPLANT
SHEATH 9FR PRELUDE SNAP 13 (SHEATH) IMPLANT
TRAY PACEMAKER INSERTION (PACKS) ×1 IMPLANT

## 2023-03-02 NOTE — Interval H&P Note (Signed)
History and Physical Interval Note:  03/02/2023 2:42 PM  Melody Robles  has presented today for surgery, with the diagnosis of cardiomyopathy.  The various methods of treatment have been discussed with the patient and family. After consideration of risks, benefits and other options for treatment, the patient has consented to  Procedure(s): ICD IMPLANT (N/A) as a surgical intervention.  The patient's history has been reviewed, patient examined, no change in status, stable for surgery.  I have reviewed the patient's chart and labs.  Questions were answered to the patient's satisfaction.     Roberts Gaudy Martie Muhlbauer

## 2023-03-02 NOTE — Discharge Instructions (Signed)
After Your ICD (Implantable Cardiac Defibrillator)   You have a Medtronic ICD  ACTIVITY Do not lift your arm above shoulder height for 1 week after your procedure. After 7 days, you may progress as below.  You should remove your sling 24 hours after your procedure, unless otherwise instructed by your provider.     Tuesday March 09, 2023  Wednesday March 10, 2023 Thursday March 11, 2023 Friday March 12, 2023   Do not lift, push, pull, or carry anything over 10 pounds with the affected arm until 6 weeks (Tuesday April 13, 2023 ) after your procedure.   You may drive AFTER your wound check, unless you have been told otherwise by your provider.   Ask your healthcare provider when you can go back to work   INCISION/Dressing If you are on a blood thinner such as Coumadin, Xarelto, Eliquis, Plavix, or Pradaxa please confirm with your provider when this should be resumed.   If large square, outer bandage is left in place, this can be removed after 24 hours from your procedure. Do not remove steri-strips or glue as below.   Monitor your defibrillator site for redness, swelling, and drainage. Call the device clinic at [phone] if you experience these symptoms or fever/chills.  If your incision is sealed with Steri-strips or staples, you may shower 7 days after your procedure or when told by your provider. Do not remove the steri-strips or let the shower hit directly on your site. You may wash around your site with soap and water.    If you were discharged in a sling, please do not wear this during the day more than 48 hours after your surgery unless otherwise instructed. This may increase the risk of stiffness and soreness in your shoulder.   Avoid lotions, ointments, or perfumes over your incision until it is well-healed.  You may use a hot tub or a pool AFTER your wound check appointment if the incision is completely closed.  Your ICD is designed to protect you from life  threatening heart rhythms. Because of this, you may receive a shock.   1 shock with no symptoms:  Call the office during business hours. 1 shock with symptoms (chest pain, chest pressure, dizziness, lightheadedness, shortness of breath, overall feeling unwell):  Call 911. If you experience 2 or more shocks in 24 hours:  Call 911. If you receive a shock, you should not drive for 6 months per the [place] DMV IF you receive appropriate therapy from your ICD.   ICD Alerts:  Some alerts are vibratory and others beep. These are NOT emergencies. Please call our office to let us know. If this occurs at night or on weekends, it can wait until the next business day. Send a remote transmission.  If your device is capable of reading fluid status (for heart failure), you will be offered monthly monitoring to review this with you.   DEVICE MANAGEMENT Remote monitoring is used to monitor your ICD from home. This monitoring is scheduled every 91 days by our office. It allows [place] to keep an eye on the functioning of your device to ensure it is working properly. You will routinely see your Electrophysiologist annually (more often if necessary).   You should receive your ID card for your new device in 4-8 weeks. Keep this card with you at all times once received. Consider wearing a medical alert bracelet or necklace.  Your ICD  may be MRI compatible. This will be discussed at your next  office visit/wound check.  You should avoid contact with strong electric or magnetic fields.   Do not use amateur (ham) radio equipment or electric (arc) welding torches. MP3 player headphones with magnets should not be used. Some devices are safe to use if held at least 12 inches (30 cm) from your defibrillator. These include power tools, lawn mowers, and speakers. If you are unsure if something is safe to use, ask your health care provider.  When using your cell phone, hold it to the ear that is on the opposite side from the  defibrillator. Do not leave your cell phone in a pocket over the defibrillator.  You may safely use electric blankets, heating pads, computers, and microwave ovens.  Call the office right away if: You have chest pain. You feel more than one shock. You feel more short of breath than you have felt before. You feel more light-headed than you have felt before. Your incision starts to open up.  This information is not intended to replace advice given to you by your health care provider. Make sure you discuss any questions you have with your health care provider.

## 2023-03-03 ENCOUNTER — Encounter (HOSPITAL_COMMUNITY): Payer: Self-pay | Admitting: Cardiovascular Disease
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
## 2023-03-05 ENCOUNTER — Encounter (HOSPITAL_COMMUNITY): Payer: Self-pay

## 2023-03-05 ENCOUNTER — Emergency Department (HOSPITAL_COMMUNITY)
Admission: EM | Admit: 2023-03-05 | Discharge: 2023-03-06 | Disposition: A | Discharge status: Home / Self Care | Payer: 59 | Attending: Emergency Medicine | Admitting: Emergency Medicine

## 2023-03-05 ENCOUNTER — Encounter (HOSPITAL_COMMUNITY): Payer: Self-pay | Admitting: Internal Medicine

## 2023-03-05 ENCOUNTER — Ambulatory Visit (HOSPITAL_BASED_OUTPATIENT_CLINIC_OR_DEPARTMENT_OTHER)
Admission: RE | Admit: 2023-03-05 | Discharge: 2023-03-05 | Disposition: A | Discharge status: Home / Self Care | Payer: 59 | Source: Ambulatory Visit | Attending: Internal Medicine | Admitting: Internal Medicine

## 2023-03-05 ENCOUNTER — Other Ambulatory Visit: Payer: Self-pay

## 2023-03-05 VITALS — BP 110/70 | HR 81 | Wt 258.0 lb

## 2023-03-05 DIAGNOSIS — E119 Type 2 diabetes mellitus without complications: Secondary | ICD-10-CM

## 2023-03-05 DIAGNOSIS — Z8249 Family history of ischemic heart disease and other diseases of the circulatory system: Secondary | ICD-10-CM | POA: Diagnosis not present

## 2023-03-05 DIAGNOSIS — Z833 Family history of diabetes mellitus: Secondary | ICD-10-CM | POA: Diagnosis not present

## 2023-03-05 DIAGNOSIS — I110 Hypertensive heart disease with heart failure: Secondary | ICD-10-CM | POA: Insufficient documentation

## 2023-03-05 DIAGNOSIS — Z7901 Long term (current) use of anticoagulants: Secondary | ICD-10-CM | POA: Insufficient documentation

## 2023-03-05 DIAGNOSIS — I428 Other cardiomyopathies: Secondary | ICD-10-CM | POA: Insufficient documentation

## 2023-03-05 DIAGNOSIS — I340 Nonrheumatic mitral (valve) insufficiency: Secondary | ICD-10-CM | POA: Insufficient documentation

## 2023-03-05 DIAGNOSIS — I5022 Chronic systolic (congestive) heart failure: Secondary | ICD-10-CM

## 2023-03-05 DIAGNOSIS — Z7984 Long term (current) use of oral hypoglycemic drugs: Secondary | ICD-10-CM | POA: Insufficient documentation

## 2023-03-05 DIAGNOSIS — Z8673 Personal history of transient ischemic attack (TIA), and cerebral infarction without residual deficits: Secondary | ICD-10-CM | POA: Insufficient documentation

## 2023-03-05 DIAGNOSIS — I639 Cerebral infarction, unspecified: Secondary | ICD-10-CM

## 2023-03-05 DIAGNOSIS — M79602 Pain in left arm: Secondary | ICD-10-CM

## 2023-03-05 DIAGNOSIS — X501XXA Overexertion from prolonged static or awkward postures, initial encounter: Secondary | ICD-10-CM | POA: Insufficient documentation

## 2023-03-05 DIAGNOSIS — I4729 Other ventricular tachycardia: Secondary | ICD-10-CM

## 2023-03-05 DIAGNOSIS — I10 Essential (primary) hypertension: Secondary | ICD-10-CM | POA: Diagnosis not present

## 2023-03-05 DIAGNOSIS — R0683 Snoring: Secondary | ICD-10-CM | POA: Insufficient documentation

## 2023-03-05 DIAGNOSIS — Z95810 Presence of automatic (implantable) cardiac defibrillator: Secondary | ICD-10-CM | POA: Insufficient documentation

## 2023-03-05 DIAGNOSIS — Z87891 Personal history of nicotine dependence: Secondary | ICD-10-CM | POA: Insufficient documentation

## 2023-03-05 MED ORDER — ENTRESTO 49-51 MG PO TABS: 1.0000 | ORAL_TABLET | Freq: Two times a day (BID) | ORAL | 11 refills | Status: DC
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
## 2023-03-05 NOTE — Addendum Note (Signed)
Encounter addended by: Linda Hedges, RN on: 03/05/2023 9:25 AM  Actions taken: Care Plan modified, Pharmacy for encounter modified, Order list changed, Diagnosis association updated, Clinical Note Signed

## 2023-03-05 NOTE — Patient Instructions (Signed)
INCREASE Entresto to 49/51 mg Twice daily  Your physician has requested that you have an echocardiogram. Echocardiography is a painless test that uses sound waves to create images of your heart. It provides your doctor with information about the size and shape of your heart and how well your heart's chambers and valves are working. This procedure takes approximately one hour. There are no restrictions for this procedure. Please do NOT wear cologne, perfume, aftershave, or lotions (deodorant is allowed). Please arrive 15 minutes prior to your appointment time.  Your physician recommends that you schedule a follow-up appointment in: 3 months with an echocardiogram   If you have any questions or concerns before your next appointment please send [place] a message through [place] or call our office at [phone].    TO LEAVE A MESSAGE FOR THE NURSE SELECT OPTION 2, PLEASE LEAVE A MESSAGE INCLUDING: YOUR NAME DATE OF BIRTH CALL BACK NUMBER REASON FOR CALL**this is important as we prioritize the call backs  YOU WILL RECEIVE A CALL BACK THE SAME DAY AS LONG AS YOU CALL BEFORE 4:00 PM  At the [facility], you and your health needs are our priority. As part of our continuing mission to provide you with exceptional heart care, we have created designated Provider Care Teams. These Care Teams include your primary Cardiologist (physician) and Advanced Practice Providers (APPs- Physician Assistants and Nurse Practitioners) who all work together to provide you with the care you need, when you need it.   You may see any of the following providers on your designated Care Team at your next follow up: Dr Arvilla Meres Dr Marca Ancona Dr. Marcos Eke, NP Robbie Lis, [place] [facility] [place], [place] Brynda Peon, NP Karle Plumber, PharmD   Please be sure to bring in all your medications bottles to every appointment.    Thank you for choosing [facility]

## 2023-03-06 ENCOUNTER — Emergency Department (HOSPITAL_COMMUNITY): Payer: 59

## 2023-03-06 ENCOUNTER — Other Ambulatory Visit: Payer: Self-pay

## 2023-03-06 DIAGNOSIS — M79602 Pain in left arm: Secondary | ICD-10-CM | POA: Diagnosis not present
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
## 2023-03-06 NOTE — Discharge Instructions (Signed)
You were evaluated in the Emergency Department and after careful evaluation, we did not find any emergent condition requiring admission or further testing in the hospital.  Your exam/testing today is overall reassuring.  Tests showing that your new ICD is working well.  Follow-up with your cardiologist with any issues.  Please return to the Emergency Department if you experience any worsening of your condition.   Thank you for allowing [place] to be a part of your care.

## 2023-03-09 ENCOUNTER — Other Ambulatory Visit (HOSPITAL_COMMUNITY): Payer: Self-pay | Admitting: Internal Medicine

## 2023-03-17 ENCOUNTER — Ambulatory Visit: Payer: 59 | Attending: Internal Medicine

## 2023-03-17 ENCOUNTER — Ambulatory Visit: Payer: 59

## 2023-03-17 DIAGNOSIS — I509 Heart failure, unspecified: Secondary | ICD-10-CM

## 2023-03-17 NOTE — Progress Notes (Signed)
Wound check appointment. Steri-strips removed. Wound without redness or edema. Incision edges approximated, wound well healed. Normal device function. Thresholds, sensing, and impedances consistent with implant measurements. Device programmed at 3.5V for extra safety margin until 3 month visit. Histogram distribution appropriate for patient and level of activity. No mode switches. 1 NSVT episode less than 20 beats. Patient educated about wound care, arm mobility, lifting restrictions, shock plan. ROV in 3 months with implanting physician.

## 2023-03-17 NOTE — Patient Instructions (Signed)
   After Your ICD (Implantable Cardiac Defibrillator)    Monitor your defibrillator site for redness, swelling, and drainage. Call the device clinic at [phone] if you experience these symptoms or fever/chills.  Your incision was closed with Steri-strips or staples:  You may shower 7 days after your procedure and wash your incision with soap and water. Avoid lotions, ointments, or perfumes over your incision until it is well-healed.    You may use a hot tub or a pool after your wound check appointment if the incision is completely closed.  Do not lift, push or pull greater than 10 pounds with the affected arm until 6 weeks after your procedure. There are no other restrictions in arm movement after your wound check appointment.  Your ICD is MRI compatible.  Your ICD is designed to protect you from life threatening heart rhythms. Because of this, you may receive a shock.   1 shock with no symptoms:  Call the office during business hours. 1 shock with symptoms (chest pain, chest pressure, dizziness, lightheadedness, shortness of breath, overall feeling unwell):  Call 911. If you experience 2 or more shocks in 24 hours:  Call 911. If you receive a shock, you should not drive.  [place] DMV - no driving for 6 months if you receive appropriate therapy from your ICD.   ICD Alerts:  Some alerts are vibratory and others beep. These are NOT emergencies. Please call our office to let us know. If this occurs at night or on weekends, it can wait until the next business day. Send a remote transmission.  If your device is capable of reading fluid status (for heart failure), you will be offered monthly monitoring to review this with you.   Remote monitoring is used to monitor your ICD from home. This monitoring is scheduled every 91 days by our office. It allows us to keep an eye on the functioning of your device to ensure it is working properly. You will routinely see your Electrophysiologist annually  (more often if necessary).   

## 2023-04-01 ENCOUNTER — Telehealth (HOSPITAL_COMMUNITY): Payer: Self-pay

## 2023-04-01 NOTE — Telephone Encounter (Signed)
Called patient to inform her she can complete her Itamar test. Patient stated she would complete it tonight.

## 2023-04-30 ENCOUNTER — Telehealth: Payer: Self-pay | Admitting: Student

## 2023-04-30 DIAGNOSIS — E119 Type 2 diabetes mellitus without complications: Secondary | ICD-10-CM

## 2023-04-30 NOTE — Telephone Encounter (Signed)
Rec'd call from [place] @ the pt's new mail order pharmacy.  This patient is requesting a refill on the following medication  metFORMIN (GLUCOPHAGE) 500 MG tablet    [facility] [address] [place], [place] [address] [phone]

## 2023-05-02 MED ORDER — METFORMIN HCL 500 MG PO TABS
500.0000 mg | ORAL_TABLET | Freq: Two times a day (BID) | ORAL | 3 refills | Status: DC
Start: 2023-05-02 — End: 2023-07-05

## 2023-05-05 ENCOUNTER — Encounter: Payer: 59 | Admitting: Student

## 2023-05-19 ENCOUNTER — Other Ambulatory Visit: Payer: Self-pay

## 2023-05-19 ENCOUNTER — Ambulatory Visit: Payer: 59 | Admitting: Student

## 2023-05-19 ENCOUNTER — Encounter: Payer: Self-pay | Admitting: Student

## 2023-05-19 VITALS — BP 104/71 | HR 73 | Temp 98.1°F | Ht 66.0 in | Wt 251.3 lb

## 2023-05-19 DIAGNOSIS — Z0000 Encounter for general adult medical examination without abnormal findings: Secondary | ICD-10-CM

## 2023-05-19 DIAGNOSIS — E782 Mixed hyperlipidemia: Secondary | ICD-10-CM

## 2023-05-19 DIAGNOSIS — E785 Hyperlipidemia, unspecified: Secondary | ICD-10-CM | POA: Diagnosis not present

## 2023-05-19 DIAGNOSIS — Z7984 Long term (current) use of oral hypoglycemic drugs: Secondary | ICD-10-CM | POA: Diagnosis not present

## 2023-05-19 DIAGNOSIS — I5020 Unspecified systolic (congestive) heart failure: Secondary | ICD-10-CM

## 2023-05-19 DIAGNOSIS — E1169 Type 2 diabetes mellitus with other specified complication: Secondary | ICD-10-CM

## 2023-05-19 LAB — POCT GLYCOSYLATED HEMOGLOBIN (HGB A1C): Hemoglobin A1C: 6.4 % — AB (ref 4.0–5.6)

## 2023-05-19 LAB — GLUCOSE, CAPILLARY: Glucose-Capillary: 78 mg/dL (ref 70–99)

## 2023-05-19 NOTE — Patient Instructions (Signed)
Thank you, Ms.Anandi D Pavlovic for allowing [place] to provide your care today. Today we discussed your diabetes, cholesterol, heart failure medications, and history of stroke.    I have ordered the following labs for you:   Lab Orders         Microalbumin / Creatinine Urine Ratio         Glucose, capillary         POC Hbg A1C      Tests ordered today:  - None  Referrals ordered today:   Referral Orders  No referral(s) requested today     I have ordered the following medication/changed the following medications:   Stop the following medications: There are no discontinued medications.   Start the following medications: No orders of the defined types were placed in this encounter.    Follow up: 3 months   Remember:   - No changes to your medications today. Your A1c was 6.4%, keep up the great work with the lifestyle changes you've been making.   - We will get a lipid panel at the next visit.   - Please remember to make your ophthalmology appointment when you are able to.   - Continue to follow up with your other specialists.   - Please let us know if you need any medication refills.   Should you have any questions or concerns please call the internal medicine clinic at [phone].     Teri Diltz Colbert Coyer, MD PGY-1 Internal Medicine Teaching Progam [facility]

## 2023-05-19 NOTE — Progress Notes (Signed)
Established Patient Office Visit  Subjective   Patient ID: Melody Robles, female    DOB: 09-20-1970  Age: 52 y.o. MRN: [id number]  Chief Complaint  Patient presents with   Follow-up   Diabetes   Hypertension    Patient is a 52 yo with a past medical history stated below who presents today for follow-up for diabetes management. She is accompanied by one of her daughters today. Please see problem based assessment and plan for additional details.     Past Medical History:  Diagnosis Date   Hypertension    Stroke [facility])     Review of Systems  Cardiovascular:  Negative for leg swelling.  Gastrointestinal:  Negative for abdominal pain, nausea and vomiting.  Genitourinary:  Negative for dysuria.      Objective:     BP 104/71 (BP Location: Left Arm, Cuff Size: Normal)   Pulse 73   Temp 98.1 F (36.7 C) (Oral)   Ht 5\' 6"  (1.676 m)   Wt 251 lb 4.8 oz (114 kg)   SpO2 100%   BMI 40.56 kg/m  BP Readings from Last 3 Encounters:  05/19/23 104/71  03/06/23 109/86  03/05/23 110/70   Wt Readings from Last 3 Encounters:  05/19/23 251 lb 4.8 oz (114 kg)  03/05/23 258 lb (117 kg)  03/02/23 245 lb (111.1 kg)      Physical Exam HENT:     Head: Normocephalic and atraumatic.     Nose: Nose normal.     Mouth/Throat:     Mouth: Mucous membranes are moist.  Cardiovascular:     Rate and Rhythm: Normal rate and regular rhythm.     Pulses: Normal pulses.     Heart sounds: Normal heart sounds.  Pulmonary:     Effort: Pulmonary effort is normal.     Breath sounds: Normal breath sounds.  Abdominal:     General: Bowel sounds are normal.     Palpations: Abdomen is soft.  Musculoskeletal:        General: No swelling.  Neurological:     General: No focal deficit present.     Mental Status: She is alert.  Psychiatric:        Mood and Affect: Mood normal.        Behavior: Behavior normal.    Results for orders placed or performed in visit on 05/19/23  Microalbumin /  Creatinine Urine Ratio  Result Value Ref Range   Creatinine, Urine 116.4 Not Estab. mg/dL   Microalbumin, Urine 8.0 Not Estab. ug/mL   Microalb/Creat Ratio 7 0 - 29 mg/g creat  Glucose, capillary  Result Value Ref Range   Glucose-Capillary 78 70 - 99 mg/dL  POC Hbg N8G  Result Value Ref Range   Hemoglobin A1C 6.4 (A) 4.0 - 5.6 %   HbA1c POC (<> result, manual entry)     HbA1c, POC (prediabetic range)     HbA1c, POC (controlled diabetic range)     Last hemoglobin A1c Lab Results  Component Value Date   HGBA1C 6.4 (A) 05/19/2023    The ASCVD Risk score (Arnett DK, et al., 2019) failed to calculate for the following reasons:   The patient has a prior MI or stroke diagnosis    Assessment & Plan:   Problem List Items Addressed This Visit     HLD (hyperlipidemia)    Patient is taking atorvastatin 40 mg daily, tolerating well, denies any side effects. Last lipid panel 01/03/23 with LDL 171. Patient declined further  blood tests today, will defer lipid panel until the next visit.       Type 2 diabetes mellitus (HCC) - Primary    A1c 6.4% today with microalbumin/creatinine ratio of 7 (WNL). Has been taking metformin 500 mg BID and Jardiance 10 mg, tolerating well, no complaints. Tried discussing GLP-1 again today but patient declined, states she does not want to lose weight. Patient has been busy and has not been seen by ophthalmology yet. Discussed importance of eye exam (patient not driving at this time), patient will call and set up appointment.  - Continue metformin 500 mg BID - Continue Jardiance 10 mg  - Follow up on ophthalmology referral at next visit      Relevant Orders   Microalbumin / Creatinine Urine Ratio (Completed)   POC Hbg A1C (Completed)   HFrEF (heart failure with reduced ejection fraction) (HCC)    Seen at advanced HF clinic 02/03/23. Patient has been taking Jardiance 10 mg daily, metoprolol 50 mg daily, spironolactone 25 mg daily (in the mornings), and Entresto  49-51 BID. Denies CP, SOB, swelling, or lightheadedness today. CV exam unremarkable, no lower extremity edema. Has upcoming appointment with cardiology later in November.  - Continue taking Jardiance 10 mg daily, metoprolol 50 mg daily, spironolactone 25 mg and Entresto 49-51 BID      Healthcare maintenance    Per patient, she had a pap smear in 2023 that was normal and was told to follow up in 5 years. Patient will request that records are sent to [facility]. Patient due for colonoscopy, prefers FIT test, may have had one done at other clinic, will request records. If no recent FIT test can place order at next visit. Will place order for mammogram today.       Relevant Orders   MM 3D SCREENING MAMMOGRAM BILATERAL BREAST   Return in about 3 months (around 08/19/2023) for Diabetes follow up, medication management .  Patient seen with Dr. Criselda Peaches.  Jermany Sundell Colbert Coyer, MD

## 2023-05-20 LAB — MICROALBUMIN / CREATININE URINE RATIO
Creatinine, Urine: 116.4 mg/dL
Microalb/Creat Ratio: 7 mg/g{creat} (ref 0–29)
Microalbumin, Urine: 8 ug/mL

## 2023-05-20 NOTE — Assessment & Plan Note (Signed)
Seen at advanced HF clinic 02/03/23. Patient has been taking Jardiance 10 mg daily, metoprolol 50 mg daily, spironolactone 25 mg daily (in the mornings), and Entresto 49-51 BID. Denies CP, SOB, swelling, or lightheadedness today. CV exam unremarkable, no lower extremity edema. Has upcoming appointment with cardiology later in November.  - Continue taking Jardiance 10 mg daily, metoprolol 50 mg daily, spironolactone 25 mg and Entresto 49-51 BID

## 2023-05-20 NOTE — Assessment & Plan Note (Signed)
Patient is taking atorvastatin 40 mg daily, tolerating well, denies any side effects. Last lipid panel 01/03/23 with LDL 171. Patient declined further blood tests today, will defer lipid panel until the next visit.

## 2023-05-20 NOTE — Assessment & Plan Note (Addendum)
Per patient, she had a pap smear in 2023 that was normal and was told to follow up in 5 years. Patient will request that records are sent to [facility]. Patient due for colonoscopy, prefers FIT test, may have had one done at other clinic, will request records. If no recent FIT test can place order at next visit. Will place order for mammogram today.

## 2023-05-20 NOTE — Assessment & Plan Note (Signed)
A1c 6.4% today with microalbumin/creatinine ratio of 7 (WNL). Has been taking metformin 500 mg BID and Jardiance 10 mg, tolerating well, no complaints. Tried discussing GLP-1 again today but patient declined, states she does not want to lose weight. Patient has been busy and has not been seen by ophthalmology yet. Discussed importance of eye exam (patient not driving at this time), patient will call and set up appointment.  - Continue metformin 500 mg BID - Continue Jardiance 10 mg  - Follow up on ophthalmology referral at next visit

## 2023-05-25 NOTE — Progress Notes (Signed)
 Internal Medicine Clinic Attending  I was physically present during the key portions of the resident provided service and participated in the medical decision making of patient's management care. I reviewed pertinent patient test results.  The assessment, diagnosis, and plan were formulated together and I agree with the documentation in the resident's note.  Inez Catalina, MD

## 2023-05-25 NOTE — Addendum Note (Signed)
Addended by: Debe Coder B on: 05/25/2023 09:36 AM   Modules accepted: Level of Service

## 2023-05-28 ENCOUNTER — Telehealth (HOSPITAL_COMMUNITY): Payer: Self-pay | Admitting: Internal Medicine

## 2023-05-28 NOTE — Telephone Encounter (Signed)
Called patient at [phone] about rescheduling appointment with Dr. Gala Romney on Monday 11/18. Will reschedule patient to Wednesday 11/20 in the morning (AM appointment).

## 2023-05-29 ENCOUNTER — Other Ambulatory Visit (HOSPITAL_COMMUNITY): Payer: Self-pay | Admitting: Family Medicine

## 2023-05-31 ENCOUNTER — Ambulatory Visit (HOSPITAL_COMMUNITY)
Admission: RE | Admit: 2023-05-31 | Discharge: 2023-05-31 | Disposition: A | Discharge status: Home / Self Care | Payer: 59 | Source: Ambulatory Visit | Attending: Internal Medicine | Admitting: Internal Medicine

## 2023-05-31 ENCOUNTER — Ambulatory Visit (HOSPITAL_BASED_OUTPATIENT_CLINIC_OR_DEPARTMENT_OTHER)
Admission: RE | Admit: 2023-05-31 | Discharge: 2023-05-31 | Disposition: A | Discharge status: Home / Self Care | Payer: 59 | Source: Ambulatory Visit | Attending: Internal Medicine | Admitting: Internal Medicine

## 2023-05-31 ENCOUNTER — Telehealth (HOSPITAL_COMMUNITY): Payer: Self-pay

## 2023-05-31 ENCOUNTER — Encounter (HOSPITAL_COMMUNITY): Payer: Self-pay

## 2023-05-31 VITALS — BP 107/68 | HR 74 | Wt 251.8 lb

## 2023-05-31 DIAGNOSIS — I428 Other cardiomyopathies: Secondary | ICD-10-CM | POA: Insufficient documentation

## 2023-05-31 DIAGNOSIS — E785 Hyperlipidemia, unspecified: Secondary | ICD-10-CM | POA: Diagnosis not present

## 2023-05-31 DIAGNOSIS — I5022 Chronic systolic (congestive) heart failure: Secondary | ICD-10-CM

## 2023-05-31 DIAGNOSIS — I5020 Unspecified systolic (congestive) heart failure: Secondary | ICD-10-CM

## 2023-05-31 DIAGNOSIS — I340 Nonrheumatic mitral (valve) insufficiency: Secondary | ICD-10-CM | POA: Insufficient documentation

## 2023-05-31 DIAGNOSIS — E119 Type 2 diabetes mellitus without complications: Secondary | ICD-10-CM | POA: Insufficient documentation

## 2023-05-31 DIAGNOSIS — I110 Hypertensive heart disease with heart failure: Secondary | ICD-10-CM | POA: Insufficient documentation

## 2023-05-31 LAB — ECHOCARDIOGRAM COMPLETE
AR max vel: 3.42 cm2
AV Area VTI: 3 cm2
AV Area mean vel: 3.16 cm2
AV Mean grad: 3 mm[Hg]
AV Peak grad: 6.6 mm[Hg]
Ao pk vel: 1.28 m/s
Area-P 1/2: 4.93 cm2
Calc EF: 30.8 %
S' Lateral: 6.5 cm
Single Plane A2C EF: 17.3 %
Single Plane A4C EF: 43.6 %

## 2023-05-31 LAB — BASIC METABOLIC PANEL
Anion gap: 7 (ref 5–15)
BUN: 15 mg/dL (ref 6–20)
CO2: 21 mmol/L — ABNORMAL LOW (ref 22–32)
Calcium: 8.8 mg/dL — ABNORMAL LOW (ref 8.9–10.3)
Chloride: 108 mmol/L (ref 98–111)
Creatinine, Ser: 1 mg/dL (ref 0.44–1.00)
GFR, Estimated: >60 mL/min (ref >60)
Glucose, Bld: 99 mg/dL (ref 70–99)
Potassium: 4.1 mmol/L (ref 3.5–5.1)
Sodium: 136 mmol/L (ref 135–145)

## 2023-05-31 LAB — BRAIN NATRIURETIC PEPTIDE: B Natriuretic Peptide: 186 pg/mL — ABNORMAL HIGH (ref 0.0–100.0)

## 2023-05-31 MED ORDER — ENTRESTO 97-103 MG PO TABS: 1.0000 | ORAL_TABLET | Freq: Two times a day (BID) | ORAL | 3 refills | Status: DC
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
## 2023-05-31 NOTE — Patient Instructions (Addendum)
Medication Changes:  No Changes In Medications at this time.   Lab Work:  Labs done today, your results will be available in MyChart, we will contact you for abnormal readings.  Testing/Procedures:  PIN NUMBER TO ITAMAR SLEEP STUDY IS 1234  Follow-Up in: 2 months with APP as scheduled   At the [facility], you and your health needs are our priority. We have a designated team specialized in the treatment of Heart Failure. This Care Team includes your primary Heart Failure Specialized Cardiologist (physician), Advanced Practice Providers (APPs- Physician Assistants and Nurse Practitioners), and Pharmacist who all work together to provide you with the care you need, when you need it.   You may see any of the following providers on your designated Care Team at your next follow up:  Dr. Arvilla Meres Dr. Marca Ancona Dr. Dorthula Nettles Dr. Theresia Bough Tonye Becket, NP Robbie Lis, [place] [facility] [place], [place] Brynda Peon, NP [place] Lee, NP Karle Plumber, PharmD   Please be sure to bring in all your medications bottles to every appointment.   Need to Contact us:  If you have any questions or concerns before your next appointment please send [place] a message through [place] or call our office at [phone].    TO LEAVE A MESSAGE FOR THE NURSE SELECT OPTION 2, PLEASE LEAVE A MESSAGE INCLUDING: YOUR NAME DATE OF BIRTH CALL BACK NUMBER REASON FOR CALL**this is important as we prioritize the call backs  YOU WILL RECEIVE A CALL BACK THE SAME DAY AS LONG AS YOU CALL BEFORE 4:00 PM

## 2023-05-31 NOTE — Telephone Encounter (Signed)
-----   Message from Milam sent at 05/31/2023  1:48 PM EST ----- BNP (fluid marker) is elevated. SCr and K ok. Increase Entresto to 97-103 mg bid and repeat BMP and BNP in 1 wk

## 2023-05-31 NOTE — Telephone Encounter (Signed)
Spoke with patient regarding the following results. Patient made aware and patient verbalized understanding.   Repeat labs ordered and scheduled.   New dose of Entresto sent to patient preferred pharmacy.

## 2023-06-03 ENCOUNTER — Ambulatory Visit: Payer: 59 | Attending: Cardiovascular Disease | Admitting: Cardiovascular Disease

## 2023-06-03 ENCOUNTER — Encounter: Payer: Self-pay | Admitting: Cardiovascular Disease

## 2023-06-03 VITALS — BP 124/70 | HR 88 | Ht 66.0 in | Wt 247.0 lb

## 2023-06-03 DIAGNOSIS — I4729 Other ventricular tachycardia: Secondary | ICD-10-CM | POA: Diagnosis not present

## 2023-06-03 DIAGNOSIS — I5022 Chronic systolic (congestive) heart failure: Secondary | ICD-10-CM | POA: Diagnosis not present

## 2023-06-03 DIAGNOSIS — I493 Ventricular premature depolarization: Secondary | ICD-10-CM

## 2023-06-03 DIAGNOSIS — I639 Cerebral infarction, unspecified: Secondary | ICD-10-CM

## 2023-06-03 NOTE — Patient Instructions (Signed)
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
## 2023-06-07 ENCOUNTER — Ambulatory Visit (HOSPITAL_COMMUNITY)
Admission: RE | Admit: 2023-06-07 | Discharge: 2023-06-07 | Disposition: A | Discharge status: Home / Self Care | Payer: 59 | Source: Ambulatory Visit | Attending: Internal Medicine | Admitting: Internal Medicine

## 2023-06-07 DIAGNOSIS — I5022 Chronic systolic (congestive) heart failure: Secondary | ICD-10-CM | POA: Diagnosis present

## 2023-06-07 LAB — BASIC METABOLIC PANEL
Anion gap: 9 (ref 5–15)
BUN: 12 mg/dL (ref 6–20)
CO2: 23 mmol/L (ref 22–32)
Calcium: 9.2 mg/dL (ref 8.9–10.3)
Chloride: 107 mmol/L (ref 98–111)
Creatinine, Ser: 1.02 mg/dL — ABNORMAL HIGH (ref 0.44–1.00)
GFR, Estimated: >60 mL/min (ref >60)
Glucose, Bld: 94 mg/dL (ref 70–99)
Potassium: 4.4 mmol/L (ref 3.5–5.1)
Sodium: 139 mmol/L (ref 135–145)

## 2023-06-07 LAB — BRAIN NATRIURETIC PEPTIDE: B Natriuretic Peptide: 301.1 pg/mL — ABNORMAL HIGH (ref 0.0–100.0)

## 2023-06-08 ENCOUNTER — Other Ambulatory Visit (HOSPITAL_COMMUNITY): Payer: Self-pay | Admitting: Family Medicine

## 2023-06-09 ENCOUNTER — Other Ambulatory Visit (HOSPITAL_COMMUNITY): Payer: Self-pay | Admitting: Family Medicine

## 2023-06-16 ENCOUNTER — Encounter: Payer: Self-pay | Admitting: Internal Medicine

## 2023-07-05 ENCOUNTER — Other Ambulatory Visit: Payer: Self-pay | Admitting: Student

## 2023-07-05 DIAGNOSIS — E119 Type 2 diabetes mellitus without complications: Secondary | ICD-10-CM
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
## 2023-07-30 ENCOUNTER — Telehealth (HOSPITAL_COMMUNITY): Payer: Self-pay

## 2023-07-30 NOTE — Telephone Encounter (Signed)
Called to confirm/remind patient of their appointment at the Advanced Heart Failure Clinic on 08/02/23.   Patient reminded to bring all medications and/or complete list.  Confirmed patient has transportation. Gave directions, instructed to utilize valet parking.  Confirmed appointment prior to ending call.

## 2023-08-02 ENCOUNTER — Ambulatory Visit (HOSPITAL_COMMUNITY)
Admission: RE | Admit: 2023-08-02 | Discharge: 2023-08-02 | Disposition: A | Discharge status: Home / Self Care | Payer: 59 | Source: Ambulatory Visit | Attending: Internal Medicine | Admitting: Internal Medicine

## 2023-08-02 ENCOUNTER — Encounter (HOSPITAL_COMMUNITY): Payer: Self-pay

## 2023-08-02 VITALS — BP 100/76 | HR 75 | Wt 245.4 lb

## 2023-08-02 DIAGNOSIS — I5020 Unspecified systolic (congestive) heart failure: Secondary | ICD-10-CM

## 2023-08-02 DIAGNOSIS — E119 Type 2 diabetes mellitus without complications: Secondary | ICD-10-CM | POA: Insufficient documentation

## 2023-08-02 DIAGNOSIS — I110 Hypertensive heart disease with heart failure: Secondary | ICD-10-CM | POA: Diagnosis not present

## 2023-08-02 DIAGNOSIS — I340 Nonrheumatic mitral (valve) insufficiency: Secondary | ICD-10-CM | POA: Diagnosis not present

## 2023-08-02 DIAGNOSIS — I428 Other cardiomyopathies: Secondary | ICD-10-CM | POA: Insufficient documentation

## 2023-08-02 DIAGNOSIS — I4729 Other ventricular tachycardia: Secondary | ICD-10-CM | POA: Insufficient documentation

## 2023-08-02 DIAGNOSIS — R0683 Snoring: Secondary | ICD-10-CM | POA: Insufficient documentation

## 2023-08-02 DIAGNOSIS — Z87891 Personal history of nicotine dependence: Secondary | ICD-10-CM | POA: Diagnosis not present

## 2023-08-02 DIAGNOSIS — I5022 Chronic systolic (congestive) heart failure: Secondary | ICD-10-CM | POA: Insufficient documentation

## 2023-08-02 DIAGNOSIS — I10 Essential (primary) hypertension: Secondary | ICD-10-CM

## 2023-08-02 DIAGNOSIS — Z7901 Long term (current) use of anticoagulants: Secondary | ICD-10-CM | POA: Diagnosis not present

## 2023-08-02 DIAGNOSIS — Z8673 Personal history of transient ischemic attack (TIA), and cerebral infarction without residual deficits: Secondary | ICD-10-CM | POA: Diagnosis not present

## 2023-08-02 DIAGNOSIS — I639 Cerebral infarction, unspecified: Secondary | ICD-10-CM | POA: Diagnosis not present

## 2023-08-02 LAB — BASIC METABOLIC PANEL
Anion gap: 7 (ref 5–15)
BUN: 17 mg/dL (ref 6–20)
CO2: 23 mmol/L (ref 22–32)
Calcium: 9.4 mg/dL (ref 8.9–10.3)
Chloride: 108 mmol/L (ref 98–111)
Creatinine, Ser: 1.04 mg/dL — ABNORMAL HIGH (ref 0.44–1.00)
GFR, Estimated: >60 mL/min (ref >60)
Glucose, Bld: 99 mg/dL (ref 70–99)
Potassium: 4.7 mmol/L (ref 3.5–5.1)
Sodium: 138 mmol/L (ref 135–145)

## 2023-08-02 LAB — CBC
HCT: 44.3 % (ref 36.0–46.0)
Hemoglobin: 13.5 g/dL (ref 12.0–15.0)
MCH: 24.1 pg — ABNORMAL LOW (ref 26.0–34.0)
MCHC: 30.5 g/dL (ref 30.0–36.0)
MCV: 79 fL — ABNORMAL LOW (ref 80.0–100.0)
Platelets: 375 10*3/uL (ref 150–400)
RBC: 5.61 MIL/uL — ABNORMAL HIGH (ref 3.87–5.11)
RDW: 15.9 % — ABNORMAL HIGH (ref 11.5–15.5)
WBC: 6.5 10*3/uL (ref 4.0–10.5)
nRBC: 0 % (ref 0.0–0.2)

## 2023-08-02 LAB — BRAIN NATRIURETIC PEPTIDE: B Natriuretic Peptide: 317.7 pg/mL — ABNORMAL HIGH (ref 0.0–100.0)

## 2023-08-02 LAB — MAGNESIUM: Magnesium: 2.2 mg/dL (ref 1.7–2.4)

## 2023-08-02 MED ORDER — IVABRADINE HCL 5 MG PO TABS: 2.5000 mg | ORAL_TABLET | Freq: Two times a day (BID) | ORAL | 2 refills | Status: DC

## 2023-08-02 NOTE — Patient Instructions (Addendum)
Medication Changes:  RESTART:CORLANOR 2.5MG  TWICE DAILY   SAMPLES GIVEN TODAY   Lab Work:  Labs done today, your results will be available in MyChart, we will contact you for abnormal readings.  PLEASE DO YOUR SLEEP STUDY (ITAMAR)  Follow-Up in: 3 MONTHS AS SCHEDULED WITH APP   At the Advanced Heart Failure Clinic, you and your health needs are our priority. We have a designated team specialized in the treatment of Heart Failure. This Care Team includes your primary Heart Failure Specialized Cardiologist (physician), Advanced Practice Providers (APPs- Physician Assistants and Nurse Practitioners), and Pharmacist who all work together to provide you with the care you need, when you need it.   You may see any of the following providers on your designated Care Team at your next follow up:  Dr. Arvilla Meres Dr. Marca Ancona Dr. Dorthula Nettles Dr. Theresia Bough Tonye Becket, NP Robbie Lis, [place] [facility] [place], [place] Brynda Peon, NP [place] Lee, NP Karle Plumber, PharmD   Please be sure to bring in all your medications bottles to every appointment.   Need to Contact us:  If you have any questions or concerns before your next appointment please send [place] a message through [place] or call our office at [phone].    TO LEAVE A MESSAGE FOR THE NURSE SELECT OPTION 2, PLEASE LEAVE A MESSAGE INCLUDING: YOUR NAME DATE OF BIRTH CALL BACK NUMBER REASON FOR CALL**this is important as we prioritize the call backs  YOU WILL RECEIVE A CALL BACK THE SAME DAY AS LONG AS YOU CALL BEFORE 4:00 PM

## 2023-08-02 NOTE — Progress Notes (Signed)
Medication Samples have been provided to the patient.  Drug name: CORLANOR       Strength: 5MG         Qty: 1 BOTTLE  LOT: [id number]  Exp.Date: 11/09/25  Dosing instructions: TAKE 1/2 TABLET TWICE DAILY   The patient has been instructed regarding the correct time, dose, and frequency of taking this medication, including desired effects and most common side effects.   Degan Hanser B Westley Blass 11:55 AM 08/02/2023;E

## 2023-08-16 ENCOUNTER — Telehealth (HOSPITAL_COMMUNITY): Payer: Self-pay | Admitting: Cardiology

## 2023-08-16 ENCOUNTER — Telehealth (HOSPITAL_COMMUNITY): Payer: Self-pay | Admitting: Pharmacy Technician

## 2023-08-16 ENCOUNTER — Other Ambulatory Visit (HOSPITAL_COMMUNITY): Payer: Self-pay

## 2023-08-16 MED ORDER — IVABRADINE HCL 5 MG PO TABS: 2.5000 mg | ORAL_TABLET | Freq: Two times a day (BID) | ORAL | 11 refills | Status: DC

## 2023-08-16 NOTE — Addendum Note (Signed)
Addended by: Cornelius Marullo, Milagros Reap on: 08/16/2023 01:44 PM   Modules accepted: Orders

## 2023-08-16 NOTE — Telephone Encounter (Signed)
Pa needed for corlanor Message to pharmacy team

## 2023-08-16 NOTE — Telephone Encounter (Signed)
Advanced Heart Failure Patient Advocate Encounter  Received a message that Corlanor needed a PA.Current script is written for a 60 day supply and test claim shows that it is over the allowed 30 day limit. Insurance verification completed.   The patient is insured through  [facility] Medicare    Ran test claim for [facility] [employer]. Currently a quantity of 30 is a 30 day supply and the co-pay is $0. Called and updated patient. Sent 30 day RX request to [facility] (CMA) to send to patients pharmacy.  This test claim was processed through [facility]- copay amounts may vary at other pharmacies due to pharmacy/plan contracts, or as the patient moves through the different stages of their insurance plan.

## 2023-08-16 NOTE — Telephone Encounter (Signed)
Advanced Heart Failure Patient Advocate Encounter  Patients insurance requires 30 day supply, current rx is written for 60 days. After this is updated, should go through for $0.  Archer Asa, CPhT

## 2023-08-18 ENCOUNTER — Ambulatory Visit: Payer: 59 | Admitting: Student

## 2023-08-18 ENCOUNTER — Other Ambulatory Visit: Payer: Self-pay

## 2023-08-18 VITALS — BP 116/74 | HR 77 | Temp 98.2°F | Resp 24 | Ht 66.0 in | Wt 249.9 lb

## 2023-08-18 DIAGNOSIS — E1169 Type 2 diabetes mellitus with other specified complication: Secondary | ICD-10-CM | POA: Diagnosis not present

## 2023-08-18 DIAGNOSIS — Z7984 Long term (current) use of oral hypoglycemic drugs: Secondary | ICD-10-CM

## 2023-08-18 DIAGNOSIS — Z8673 Personal history of transient ischemic attack (TIA), and cerebral infarction without residual deficits: Secondary | ICD-10-CM

## 2023-08-18 DIAGNOSIS — E785 Hyperlipidemia, unspecified: Secondary | ICD-10-CM | POA: Diagnosis not present

## 2023-08-18 DIAGNOSIS — I10 Essential (primary) hypertension: Secondary | ICD-10-CM

## 2023-08-18 DIAGNOSIS — I110 Hypertensive heart disease with heart failure: Secondary | ICD-10-CM

## 2023-08-18 DIAGNOSIS — I5020 Unspecified systolic (congestive) heart failure: Secondary | ICD-10-CM | POA: Diagnosis not present

## 2023-08-18 DIAGNOSIS — Z0000 Encounter for general adult medical examination without abnormal findings: Secondary | ICD-10-CM

## 2023-08-18 DIAGNOSIS — E782 Mixed hyperlipidemia: Secondary | ICD-10-CM

## 2023-08-18 LAB — POCT GLYCOSYLATED HEMOGLOBIN (HGB A1C): Hemoglobin A1C: 5.9 % — AB (ref 4.0–5.6)

## 2023-08-18 LAB — GLUCOSE, CAPILLARY: Glucose-Capillary: 85 mg/dL (ref 70–99)

## 2023-08-18 NOTE — Progress Notes (Signed)
 Established Patient Office Visit  Subjective   Patient ID: Melody Robles, female    DOB: Dec 15, 1970  Age: 53 y.o. MRN: [id number]  Chief Complaint  Patient presents with   Follow-up    Patient is a 53 y.o. with a past medical history stated below who presents today for follow-up for CHF, HTN, NSVT, CVA, tobacco use, T2DM, and sleep study referral. She is accompanied by her daughter today. Last [facility] visit 05/19/2023.  Please see problem based assessment and plan for additional details.     Past Medical History:  Diagnosis Date   Hypertension    Stroke (HCC)       ROS    Objective:     BP 116/74 (BP Location: Right Arm, Patient Position: Sitting, Cuff Size: Normal)   Pulse 77   Temp 98.2 F (36.8 C) (Oral)   Resp (!) 24   Ht 5' 6 (1.676 m)   Wt 249 lb 14.4 oz (113.4 kg)   SpO2 100% Comment: room air  BMI 40.33 kg/m  BP Readings from Last 3 Encounters:  08/18/23 116/74  08/02/23 100/76  06/03/23 124/70   Wt Readings from Last 3 Encounters:  08/18/23 249 lb 14.4 oz (113.4 kg)  08/02/23 245 lb 6.4 oz (111.3 kg)  06/03/23 247 lb (112 kg)      Physical Exam   Results for orders placed or performed in visit on 08/18/23  Glucose, capillary  Result Value Ref Range   Glucose-Capillary 85 70 - 99 mg/dL  POC Hbg J8R  Result Value Ref Range   Hemoglobin A1C 5.9 (A) 4.0 - 5.6 %   HbA1c POC (<> result, manual entry)     HbA1c, POC (prediabetic range)     HbA1c, POC (controlled diabetic range)      Last hemoglobin A1c Lab Results  Component Value Date   HGBA1C 5.9 (A) 08/18/2023    The ASCVD Risk score (Arnett DK, et al., 2019) failed to calculate for the following reasons:   Risk score cannot be calculated because patient has a medical history suggesting prior/existing ASCVD    Assessment & Plan:   Problem List Items Addressed This Visit     History of CVA (cerebrovascular accident)   Patient with history of CVA 2021 and 2024. On Eliquis  5 mg BID.  Takes it regularly, denies overt bleeding or bruising. Last Hgb 13.5 on 08/02/23.  Plan - Continue Eliquis  5 mg BID      HTN (hypertension)   BP 116/74 today. Patient denies chest pain, shortness of breath, or swelling. CV exam unremarkable. No changes to antihypertensive medication regimen today.  Plan - Continue Ivabradine  2.5 mg BID, Entresto  97-103 mg BID, Jardiance  10 mg daily, spironolactone  25 mg daily, and metoprolol  50 mg daily  - Return to clinic in 3 months for regular follow up       HLD (hyperlipidemia)   Taking atorvastatin  40 mg daily. Tolerating well. Will plan to repeat lipid panel at next visit (last LDL 171 June 2024).  Plan - Continue atorvastatin  40 mg daily - Return to clinic in 3 months, repeat lipid panel       Type 2 diabetes mellitus (HCC) - Primary   Hgb A1c today 5.9% from from 6.4% 3 months ago. Patient taking metformin  500 mg BID and Jardiance  10 mg daily. Denies any side effects. Diabetic foot exam up to date. Patient previously referred to ophthalmology for annual exam, still needs to schedule.  Plan - Continue metformin  500  mg BID and Jardiance  10 mg daily      Relevant Orders   POC Hbg A1C (Completed)   HFrEF (heart failure with reduced ejection fraction) (HCC)   Last Echo November 2024 with EF 20-25%. Confirmed medications outlined in visit with Heart Failure team on 08/03/23 including Ivabradine  2.5 mg BID, Entresto  97-103 mg BID, Jardiance  10 mg daily, spironolactone  25 mg daily, and metoprolol  50 mg daily. Confirmed patient is taking 50 mg daily, messaged Beckey Coe, NP with HF who also confirmed this dose. Patient does not have any complaints today, no swelling on exam. Patient to continue regular follow up with cardiology. BMP from January 2025 stable, no need to repeat today.  Plan - Continue Ivabradine  2.5 mg BID, Entresto  97-103 mg BID, Jardiance  10 mg daily, spironolactone  25 mg daily, and metoprolol  50 mg daily       Healthcare maintenance    Patient declined pneumonia vaccine today. Obtained permission from patient to request records from [facility] for cytopathology report from last pap smear, which is not due until 2028. Reminded patient to call and schedule mammogram, which was ordered November 2024.       Patient discussed with Dr. Karna.  Return in about 3 months (around 11/15/2023) for BP, diabetes, and healthcare maintenance follow up .   Whitney Bingaman Arellano Zameza, MD

## 2023-08-18 NOTE — Patient Instructions (Signed)
 Thank you, Ms.Amora D Nevins for allowing us  to provide your care today. Today we discussed your blood pressure, history of stroke, Type 2 diabetes, and healthcare maintenance.   I have ordered the following labs for you:   Lab Orders         Glucose, capillary         POC Hbg A1C      Tests ordered today:  None  Referrals ordered today:   Referral Orders  No referral(s) requested today     I have ordered the following medication/changed the following medications:   Stop the following medications: There are no discontinued medications.   Start the following medications: No orders of the defined types were placed in this encounter.    Follow up: 3 months for BP, diabetes, and healthcare maintenance follow up    Remember:   - I will call you with the results of your A1c and let you know if we need to make any medication adjustments.   - For now, no changes to your medications.   - I will contact your cardiologist's office to clarify the dose of your metoprolol  and then call you to let you know. For now take it as you have been.   - Please remember to call to schedule your mammogram (radiology at [facility] # [phone]), call your eye doctor to make an appointment, and complete your sleep study.   - We will reach out to [facility] to get your pap smear records.   Should you have any questions or concerns please call the internal medicine clinic at [phone].     Genevieve Arbaugh Arellano Zameza, MD PGY-1 Internal Medicine Teaching Progam [facility]

## 2023-08-20 NOTE — Assessment & Plan Note (Addendum)
 Hgb A1c today 5.9% from from 6.4% 3 months ago. Patient taking metformin  500 mg BID and Jardiance  10 mg daily. Denies any side effects. Diabetic foot exam up to date. Patient previously referred to ophthalmology for annual exam, still needs to schedule.  Plan - Continue metformin  500 mg BID and Jardiance  10 mg daily

## 2023-08-20 NOTE — Assessment & Plan Note (Addendum)
 Patient declined pneumonia vaccine today. Obtained permission from patient to request records from [facility] for cytopathology report from last pap smear, which is not due until 2028. Reminded patient to call and schedule mammogram, which was ordered November 2024.

## 2023-08-20 NOTE — Assessment & Plan Note (Addendum)
 Last Echo November 2024 with EF 20-25%. Confirmed medications outlined in visit with Heart Failure team on 08/03/23 including Ivabradine  2.5 mg BID, Entresto  97-103 mg BID, Jardiance  10 mg daily, spironolactone  25 mg daily, and metoprolol  50 mg daily. Confirmed patient is taking 50 mg daily, messaged Beckey Coe, NP with HF who also confirmed this dose. Patient does not have any complaints today, no swelling on exam. Patient to continue regular follow up with cardiology. BMP from January 2025 stable, no need to repeat today.  Plan - Continue Ivabradine  2.5 mg BID, Entresto  97-103 mg BID, Jardiance  10 mg daily, spironolactone  25 mg daily, and metoprolol  50 mg daily

## 2023-08-20 NOTE — Assessment & Plan Note (Signed)
 Taking atorvastatin  40 mg daily. Tolerating well. Will plan to repeat lipid panel at next visit (last LDL 171 June 2024).  Plan - Continue atorvastatin  40 mg daily - Return to clinic in 3 months, repeat lipid panel

## 2023-08-20 NOTE — Assessment & Plan Note (Signed)
 Patient with history of CVA 2021 and 2024. On Eliquis  5 mg BID. Takes it regularly, denies overt bleeding or bruising. Last Hgb 13.5 on 08/02/23.  Plan - Continue Eliquis  5 mg BID

## 2023-08-20 NOTE — Assessment & Plan Note (Addendum)
 BP 116/74 today. Patient denies chest pain, shortness of breath, or swelling. CV exam unremarkable. No changes to antihypertensive medication regimen today.  Plan - Continue Ivabradine  2.5 mg BID, Entresto  97-103 mg BID, Jardiance  10 mg daily, spironolactone  25 mg daily, and metoprolol  50 mg daily  - Return to clinic in 3 months for regular follow up

## 2023-08-23 ENCOUNTER — Other Ambulatory Visit (HOSPITAL_COMMUNITY): Payer: Self-pay | Admitting: Cardiology

## 2023-08-24 NOTE — Telephone Encounter (Signed)
This is a CHF pt

## 2023-08-25 NOTE — Progress Notes (Signed)
 Internal Medicine Clinic Attending  Case discussed with the resident at the time of the visit.  We reviewed the resident's history and exam and pertinent patient test results.  I agree with the assessment, diagnosis, and plan of care documented in the resident's note.

## 2023-08-25 NOTE — Addendum Note (Signed)
Addended by: Dickie La on: 08/25/2023 10:12 AM   Modules accepted: Level of Service

## 2023-09-01 NOTE — Patient Instructions (Signed)
 Below is our plan:  Please continue close follow up with care team. Complete HST this week and return to cardiology. Continue working on [employer]. Add 30 minutes of cardiovascular activity daily.   Goals:  1) Maintain strict control of hypertension with blood pressure goal below 130/90 2) Maintain good control of diabetes with hemoglobin A1c goal below 7%  3) Maintain good control of lipids with LDL cholesterol goal below 70 mg/dL.  4) Eat a healthy diet with plenty of whole grains, cereals, fruits and vegetables, exercise regularly and maintain ideal body weight   Resources: https://www.williams.biz/  Please make sure you are staying well hydrated. I recommend 50-60 ounces daily. Well balanced diet and regular exercise encouraged. Consistent sleep schedule with 6-8 hours recommended.   Please continue follow up with care team as directed.   Follow up with me as needed   You may receive a survey regarding today's visit. I encourage you to leave honest feed back as I do use this information to improve patient care. Thank you for seeing me today!
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
## 2023-09-02 NOTE — Progress Notes (Signed)
Addressed during office visit with Dr. Justin Mend

## 2023-09-03 ENCOUNTER — Ambulatory Visit: Payer: 59 | Attending: Cardiovascular Disease

## 2023-09-03 DIAGNOSIS — I4729 Other ventricular tachycardia: Secondary | ICD-10-CM | POA: Diagnosis not present

## 2023-09-05 LAB — CUP PACEART REMOTE DEVICE CHECK
Battery Remaining Longevity: 164 mo
Battery Voltage: 3.07 V
Brady Statistic RA Percent Paced: INVALID
Brady Statistic RV Percent Paced: 0 %
Date Time Interrogation Session: 20250221091927
HighPow Impedance: 71 Ohm
Implantable Lead Connection Status: 753985
Implantable Lead Implant Date: 20240820
Implantable Lead Location: 753860
Implantable Pulse Generator Implant Date: 20240820
Lead Channel Impedance Value: 437 Ohm
Lead Channel Impedance Value: 551 Ohm
Lead Channel Pacing Threshold Amplitude: 0.5 V
Lead Channel Pacing Threshold Pulse Width: 0.4 ms
Lead Channel Sensing Intrinsic Amplitude: 21.9 mV
Lead Channel Setting Pacing Amplitude: 2 V
Lead Channel Setting Pacing Pulse Width: 0.4 ms
Lead Channel Setting Sensing Sensitivity: 0.3 mV

## 2023-09-06 ENCOUNTER — Encounter: Payer: Self-pay | Admitting: Family Medicine

## 2023-09-06 ENCOUNTER — Ambulatory Visit (INDEPENDENT_AMBULATORY_CARE_PROVIDER_SITE_OTHER): Payer: Medicare Other | Admitting: Family Medicine

## 2023-09-06 ENCOUNTER — Telehealth: Payer: Self-pay

## 2023-09-06 VITALS — BP 107/73 | HR 81 | Ht 66.0 in | Wt 251.0 lb

## 2023-09-06 DIAGNOSIS — I5020 Unspecified systolic (congestive) heart failure: Secondary | ICD-10-CM | POA: Diagnosis not present

## 2023-09-06 DIAGNOSIS — Z8673 Personal history of transient ischemic attack (TIA), and cerebral infarction without residual deficits: Secondary | ICD-10-CM | POA: Diagnosis not present

## 2023-09-06 NOTE — Telephone Encounter (Signed)
 Patient returned call. Advised of [place] DMV driving restrictions x6 months per [place] DMV with start date 07/20/23. Pt voiced understanding.   Patient denies any symptoms but reports her fiance passed away during this time in which she was under a lot of stress.   Routing to Dr. Nelly Laurence to update.

## 2023-09-06 NOTE — Telephone Encounter (Signed)
 Alert received from [employer] for 18 VHR consistent with NSVT, longest 27 beats on 07/24/23, three EGMs show NCT.  Longest VT > 20 beats with VT detection programmed at 16 beats, interval plot shows a few beats occurring below detection rate that would reset VT counter, routing to triage for review.  1 device classified VT treated with successfully with ATP on 07/20/23.  HF diagnostics have been normal this monitoring period, currently trending toward abnormal.  This alert was reviewed during HF OV and sent to Dr. Nelly Laurence as scheduled transmission for review.   Attempted to contact patient to advise driving restrictions. Phone will not ring. Will send mychart message.

## 2023-09-08 ENCOUNTER — Encounter: Payer: Self-pay | Admitting: Physician Assistant

## 2023-09-08 ENCOUNTER — Ambulatory Visit: Payer: 59 | Attending: Physician Assistant | Admitting: Physician Assistant

## 2023-09-08 VITALS — BP 124/72 | HR 80 | Ht 66.0 in | Wt 246.6 lb

## 2023-09-08 DIAGNOSIS — Z95810 Presence of automatic (implantable) cardiac defibrillator: Secondary | ICD-10-CM

## 2023-09-08 DIAGNOSIS — I428 Other cardiomyopathies: Secondary | ICD-10-CM

## 2023-09-08 DIAGNOSIS — I472 Ventricular tachycardia, unspecified: Secondary | ICD-10-CM

## 2023-09-08 DIAGNOSIS — I493 Ventricular premature depolarization: Secondary | ICD-10-CM

## 2023-09-08 LAB — CUP PACEART INCLINIC DEVICE CHECK
Date Time Interrogation Session: 20250226171003
Implantable Lead Connection Status: 753985
Implantable Lead Implant Date: 20240820
Implantable Lead Location: 753860
Implantable Pulse Generator Implant Date: 20240820

## 2023-09-08 NOTE — Patient Instructions (Signed)
 Medication Instructions:    Your physician recommends that you continue on your current medications as directed. Please refer to the Current Medication list given to you today.]  *If you need a refill on your cardiac medications before your next appointment, please call your pharmacy*   Lab Work: NONE ORDERED  TODAY    If you have labs (blood work) drawn today and your tests are completely normal, you will receive your results only by: MyChart Message (if you have MyChart) OR A paper copy in the mail If you have any lab test that is abnormal or we need to change your treatment, we will call you to review the results.   Testing/Procedures:  NONE ORDERED  TODAY      Follow-Up: At [facility], you and your health needs are our priority.  As part of our continuing mission to provide you with exceptional heart care, we have created designated Provider Care Teams.  These Care Teams include your primary Cardiologist (physician) and Advanced Practice Providers (APPs -  Physician Assistants and Nurse Practitioners) who all work together to provide you with the care you need, when you need it.  We recommend signing up for the patient portal called "MyChart".  Sign up information is provided on this After Visit Summary.  MyChart is used to connect with patients for Virtual Visits (Telemedicine).  Patients are able to view lab/test results, encounter notes, upcoming appointments, etc.  Non-urgent messages can be sent to your provider as well.   To learn more about what you can do with MyChart, go to [email or web address].    Your next appointment:  WITH [facility]      Provider:  IN [place]   Other Instructions
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
## 2023-09-12 ENCOUNTER — Encounter: Payer: Self-pay | Admitting: Cardiovascular Disease

## 2023-10-05 ENCOUNTER — Encounter (INDEPENDENT_AMBULATORY_CARE_PROVIDER_SITE_OTHER): Admitting: Cardiology

## 2023-10-05 DIAGNOSIS — G4733 Obstructive sleep apnea (adult) (pediatric): Secondary | ICD-10-CM | POA: Diagnosis not present

## 2023-10-05 NOTE — Addendum Note (Signed)
 Addended by: Elease Etienne A on: 10/05/2023 10:59 AM   Modules accepted: Orders

## 2023-10-05 NOTE — Progress Notes (Signed)
 Remote ICD transmission.

## 2023-10-12 ENCOUNTER — Ambulatory Visit: Attending: Family Medicine

## 2023-10-12 DIAGNOSIS — I5020 Unspecified systolic (congestive) heart failure: Secondary | ICD-10-CM

## 2023-10-12 NOTE — Procedures (Signed)
    SLEEP STUDY REPORT Patient Information Study Date: 10/05/2023 Patient Name: Melody Robles Patient ID: [id number] Birth Date: [birth year] Age: 53 Gender: Female BMI: 40.7 (W=253 lb, H=5' 6'') Stopbang: 4 Referring Physician: Prince Rome, NP  TEST DESCRIPTION: Home sleep apnea testing was completed using the WatchPat, a Type 1 device, utilizing peripheral arterial tonometry (PAT), chest movement, actigraphy, pulse oximetry, pulse rate, body position and snore. AHI was calculated with apnea and hypopnea using valid sleep time as the denominator. RDI includes apneas, hypopneas, and RERAs. The data acquired and the scoring of sleep and all associated events were performed in accordance with the recommended standards and specifications as outlined in the AASM Manual for the Scoring of Sleep and Associated Events 2.2.0 (2015).  FINDINGS:  1. Mild Obstructive Sleep Apnea with AHI 5.4/hr.  2. No Central Sleep Apnea with pAHIc 0.9/hr.  3. Oxygen desaturations as low as 86%.  4. Mild to moderate snoring was present. O2 sats were < 88% for 0.2 min.  5. Total sleep time was 5 hrs and 32 min.  6. 18.4% of total sleep time was spent in REM sleep.  7. Normal sleep onset latency at 21 min.  8. Shortened REM sleep onset latency at 42 min.  9. Total awakenings were 5. 10. Arrhythmia detection: None  DIAGNOSIS: Mild Obstructive Sleep Apnea (G47.33)  RECOMMENDATIONS: 1. Clinical correlation of these findings is necessary. The decision to treat obstructive sleep apnea (OSA) is usually based on the presence of apnea symptoms or the presence of associated medical conditions such as Hypertension, Congestive Heart Failure, Atrial Fibrillation or Obesity. The most common symptoms of OSA are snoring, gasping for breath while sleeping, daytime sleepiness and fatigue.  2. Initiating apnea therapy is recommended given the presence of symptoms and/or associated conditions. Recommend proceeding  with one of the following:   a. Auto-CPAP therapy with a pressure range of 5-20cm H2O.   b. An oral appliance (OA) that can be obtained from certain dentists with expertise in sleep medicine. These are primarily of use in non-obese patients with mild and moderate disease.   c. An ENT consultation which may be useful to look for specific causes of obstruction and possible treatment options.   d. If patient is intolerant to PAP therapy, consider referral to ENT for evaluation for hypoglossal nerve stimulator.  3. Close follow-up is necessary to ensure success with CPAP or oral appliance therapy for maximum benefit .  4. A follow-up oximetry study on CPAP is recommended to assess the adequacy of therapy and determine the need for supplemental oxygen or the potential need for Bi-level therapy. An arterial blood gas to determine the adequacy of baseline ventilation and oxygenation should also be considered.  5. Healthy sleep recommendations include: adequate nightly sleep (normal 7-9 hrs/night), avoidance of caffeine after noon and alcohol near bedtime, and maintaining a sleep environment that is cool, dark and quiet.  6. Weight loss for overweight patients is recommended. Even modest amounts of weight loss can significantly improve the severity of sleep apnea.  7. Snoring recommendations include: weight loss where appropriate, side sleeping, and avoidance of alcohol before bed.  8. Operation of motor vehicle should be avoided when sleepy.  Signature: Armanda Magic, MD; [facility]; Diplomat, American Board of Sleep Medicine Electronically Signed: 10/12/2023 5:27:23 PM

## 2023-10-13 ENCOUNTER — Telehealth: Payer: Self-pay

## 2023-10-13 NOTE — Telephone Encounter (Signed)
 Notified patient, per DPR, of sleep study results and recommendations. Patient will receive a call from Scheduler to come in and discuss treatment options.

## 2023-10-13 NOTE — Telephone Encounter (Signed)
-----   Message from Armanda Magic sent at 10/12/2023  5:29 PM EDT ----- Patient has very mild OSA - set up OV to discuss treatment options.

## 2023-10-29 ENCOUNTER — Telehealth (HOSPITAL_COMMUNITY): Payer: Self-pay

## 2023-10-29 NOTE — Telephone Encounter (Signed)
 Called to confirm/remind patient of [facility] appointment [facility] on [facility].   Appointment:   [x] Confirmed  [] Left mess   [] No answer/No voice mail  [] VM Full/unable to leave message  [] Phone not in service  Patient reminded to bring all medications and/or complete list.  Confirmed patient has transportation. Gave directions, instructed to utilize valet parking.

## 2023-11-01 ENCOUNTER — Ambulatory Visit (HOSPITAL_COMMUNITY)
Admission: RE | Admit: 2023-11-01 | Discharge: 2023-11-01 | Disposition: A | Discharge status: Home / Self Care | Payer: 59 | Source: Ambulatory Visit | Attending: Family Medicine | Admitting: Family Medicine

## 2023-11-01 ENCOUNTER — Other Ambulatory Visit (HOSPITAL_COMMUNITY): Payer: Self-pay | Admitting: Family Medicine

## 2023-11-01 ENCOUNTER — Encounter (HOSPITAL_COMMUNITY): Payer: Self-pay

## 2023-11-01 VITALS — BP 138/70 | HR 63 | Wt 253.4 lb

## 2023-11-01 DIAGNOSIS — G4733 Obstructive sleep apnea (adult) (pediatric): Secondary | ICD-10-CM | POA: Diagnosis not present

## 2023-11-01 DIAGNOSIS — Z87891 Personal history of nicotine dependence: Secondary | ICD-10-CM

## 2023-11-01 DIAGNOSIS — I428 Other cardiomyopathies: Secondary | ICD-10-CM | POA: Diagnosis not present

## 2023-11-01 DIAGNOSIS — Z7901 Long term (current) use of anticoagulants: Secondary | ICD-10-CM | POA: Insufficient documentation

## 2023-11-01 DIAGNOSIS — Z7984 Long term (current) use of oral hypoglycemic drugs: Secondary | ICD-10-CM | POA: Insufficient documentation

## 2023-11-01 DIAGNOSIS — I110 Hypertensive heart disease with heart failure: Secondary | ICD-10-CM | POA: Diagnosis present

## 2023-11-01 DIAGNOSIS — Z79899 Other long term (current) drug therapy: Secondary | ICD-10-CM | POA: Insufficient documentation

## 2023-11-01 DIAGNOSIS — I340 Nonrheumatic mitral (valve) insufficiency: Secondary | ICD-10-CM | POA: Diagnosis not present

## 2023-11-01 DIAGNOSIS — I10 Essential (primary) hypertension: Secondary | ICD-10-CM

## 2023-11-01 DIAGNOSIS — Z8673 Personal history of transient ischemic attack (TIA), and cerebral infarction without residual deficits: Secondary | ICD-10-CM | POA: Insufficient documentation

## 2023-11-01 DIAGNOSIS — I4729 Other ventricular tachycardia: Secondary | ICD-10-CM

## 2023-11-01 DIAGNOSIS — E669 Obesity, unspecified: Secondary | ICD-10-CM | POA: Insufficient documentation

## 2023-11-01 DIAGNOSIS — E785 Hyperlipidemia, unspecified: Secondary | ICD-10-CM | POA: Diagnosis not present

## 2023-11-01 DIAGNOSIS — I5022 Chronic systolic (congestive) heart failure: Secondary | ICD-10-CM

## 2023-11-01 DIAGNOSIS — I639 Cerebral infarction, unspecified: Secondary | ICD-10-CM

## 2023-11-01 DIAGNOSIS — E119 Type 2 diabetes mellitus without complications: Secondary | ICD-10-CM | POA: Diagnosis not present

## 2023-11-01 LAB — CBC
HCT: 42.1 % (ref 36.0–46.0)
Hemoglobin: 12.8 g/dL (ref 12.0–15.0)
MCH: 24.5 pg — ABNORMAL LOW (ref 26.0–34.0)
MCHC: 30.4 g/dL (ref 30.0–36.0)
MCV: 80.5 fL (ref 80.0–100.0)
Platelets: 322 10*3/uL (ref 150–400)
RBC: 5.23 MIL/uL — ABNORMAL HIGH (ref 3.87–5.11)
RDW: 15.6 % — ABNORMAL HIGH (ref 11.5–15.5)
WBC: 6.3 10*3/uL (ref 4.0–10.5)
nRBC: 0 % (ref 0.0–0.2)

## 2023-11-01 LAB — BASIC METABOLIC PANEL WITH GFR
Anion gap: 9 (ref 5–15)
BUN: 17 mg/dL (ref 6–20)
CO2: 21 mmol/L — ABNORMAL LOW (ref 22–32)
Calcium: 9 mg/dL (ref 8.9–10.3)
Chloride: 107 mmol/L (ref 98–111)
Creatinine, Ser: 1.09 mg/dL — ABNORMAL HIGH (ref 0.44–1.00)
GFR, Estimated: >60 mL/min (ref >60)
Glucose, Bld: 130 mg/dL — ABNORMAL HIGH (ref 70–99)
Potassium: 3.9 mmol/L (ref 3.5–5.1)
Sodium: 137 mmol/L (ref 135–145)

## 2023-11-01 MED ORDER — IVABRADINE HCL 5 MG PO TABS: 2.5000 mg | ORAL_TABLET | Freq: Two times a day (BID) | ORAL | 11 refills | Status: DC

## 2023-11-01 NOTE — Patient Instructions (Addendum)
 Thank you for coming in today  If you had labs drawn today, any labs that are abnormal the clinic will call you No news is good news  Medications: No changes  Follow up appointments:  Your physician recommends that you schedule a follow-up appointment in:  3-4 months With Dr. Julane Ny  Please call our office to schedule the follow-up appointment July 2025   Do the following things EVERYDAY: Weigh yourself in the morning before breakfast. Write it down and keep it in a log. Take your medicines as prescribed Eat low salt foods--Limit salt (sodium) to 2000 mg per day.  Stay as active as you can everyday Limit all fluids for the day to less than 2 liters   At the [facility], you and your health needs are our priority. As part of our continuing mission to provide you with exceptional heart care, we have created designated Provider Care Teams. These Care Teams include your primary Cardiologist (physician) and Advanced Practice Providers (APPs- Physician Assistants and Nurse Practitioners) who all work together to provide you with the care you need, when you need it.   You may see any of the following providers on your designated Care Team at your next follow up: Dr Jules Oar Dr Peder Bourdon Dr. Mimi Alt, NP Ruddy Corral, [place] [employer] [place], [place] Dennise Fitz, NP Luster Salters, PharmD   Please be sure to bring in all your medications bottles to every appointment.    Thank you for choosing [facility]  If you have any questions or concerns before your next appointment please send us  a message through [place] or call our office at [phone].    TO LEAVE A MESSAGE FOR THE NURSE SELECT OPTION 2, PLEASE LEAVE A MESSAGE INCLUDING: YOUR NAME DATE OF BIRTH CALL BACK NUMBER REASON FOR CALL**this is important as we prioritize the call backs  YOU WILL RECEIVE A CALL BACK THE SAME  DAY AS LONG AS YOU CALL BEFORE 4:00 PM
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
## 2023-11-08 ENCOUNTER — Ambulatory Visit: Payer: Self-pay | Admitting: *Deleted

## 2023-11-08 DIAGNOSIS — Z111 Encounter for screening for respiratory tuberculosis: Secondary | ICD-10-CM

## 2023-11-10 LAB — TB SKIN TEST: Induration: 0 mm

## 2023-11-16 ENCOUNTER — Encounter: Payer: Self-pay | Admitting: Cardiology

## 2023-11-18 ENCOUNTER — Other Ambulatory Visit: Payer: Self-pay | Admitting: Student

## 2023-11-18 DIAGNOSIS — I10 Essential (primary) hypertension: Secondary | ICD-10-CM

## 2023-11-18 DIAGNOSIS — I5020 Unspecified systolic (congestive) heart failure: Secondary | ICD-10-CM

## 2023-11-19 NOTE — Telephone Encounter (Signed)
 Medication sent to pharmacy

## 2023-12-03 ENCOUNTER — Ambulatory Visit (INDEPENDENT_AMBULATORY_CARE_PROVIDER_SITE_OTHER): Payer: 59

## 2023-12-03 DIAGNOSIS — I428 Other cardiomyopathies: Secondary | ICD-10-CM | POA: Diagnosis not present

## 2023-12-03 LAB — CUP PACEART REMOTE DEVICE CHECK
Battery Remaining Longevity: 160 mo
Battery Voltage: 3.05 V
Brady Statistic RA Percent Paced: INVALID
Brady Statistic RV Percent Paced: 0.01 %
Date Time Interrogation Session: 20250523053242
HighPow Impedance: 68 Ohm
Implantable Lead Connection Status: 753985
Implantable Lead Implant Date: 20240820
Implantable Lead Location: 753860
Implantable Pulse Generator Implant Date: 20240820
Lead Channel Impedance Value: 456 Ohm
Lead Channel Impedance Value: 570 Ohm
Lead Channel Pacing Threshold Amplitude: 0.5 V
Lead Channel Pacing Threshold Pulse Width: 0.4 ms
Lead Channel Sensing Intrinsic Amplitude: 17.4 mV
Lead Channel Setting Pacing Amplitude: 2 V
Lead Channel Setting Pacing Pulse Width: 0.4 ms
Lead Channel Setting Sensing Sensitivity: 0.3 mV

## 2023-12-07 ENCOUNTER — Ambulatory Visit: Attending: Cardiology | Admitting: Cardiology

## 2023-12-07 VITALS — BP 136/68 | HR 54 | Ht 66.0 in | Wt 253.0 lb

## 2023-12-07 DIAGNOSIS — G4733 Obstructive sleep apnea (adult) (pediatric): Secondary | ICD-10-CM

## 2023-12-07 DIAGNOSIS — I10 Essential (primary) hypertension: Secondary | ICD-10-CM

## 2023-12-07 NOTE — Patient Instructions (Signed)
 Medication Instructions:  Your physician recommends that you continue on your current medications as directed. Please refer to the Current Medication list given to you today.  *If you need a refill on your cardiac medications before your next appointment, please call your pharmacy*  Lab Work: None.  If you have labs (blood work) drawn today and your tests are completely normal, you will receive your results only by: MyChart Message (if you have MyChart) OR A paper copy in the mail If you have any lab test that is abnormal or we need to change your treatment, we will call you to review the results.  Testing/Procedures: None.   Follow-Up: At [facility], you and your health needs are our priority.  As part of our continuing mission to provide you with exceptional heart care, our providers are all part of one team.  This team includes your primary Cardiologist (physician) and Advanced Practice Providers or APPs (Physician Assistants and Nurse Practitioners) who all work together to provide you with the care you need, when you need it.  Your next appointment will be as needed and it will be with:     Provider:   Dr. Gaylyn Keas, MD

## 2023-12-07 NOTE — Progress Notes (Signed)
 Sleep Medicine CONSULT Note    Date:  12/07/2023   ID:  Robles Robles, DOB 02/10/1971, MRN [id number]  PCP:  Robles Mina, DO  Cardiologist: Melody Melody, Melody Robles   Chief Complaint  Patient presents with   New Patient (Initial Visit)    Obstructive sleep apnea    History of Present Illness:  Robles Robles is a 53 y.o. female who is being seen today for the evaluation of OSA at the request of Robles Larger, Melody Robles.  This is a 53 year old female with a history of hypertension and CVA.  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  She had recurrent VT in January 2025 thought related to the stress related to the death of her fianc and she had run out of her Corlanor.  A sleep study was recommended at that time.  She underwent home sleep study 10/05/2023 which revealed mild obstructive sleep apnea with an AHI of 5.4/h and O2 sats as low as 86%.  She is now referred for sleep medicine consultation to discuss treatment options.  She tells me that she feel rested in the am when she gets up in the am.  She denies any daytime sleepiness and never takes naps. Denies any morning HAs.  She has never been told that she stops breathing in her sleep. She has never been told that she snores.  She has never awakened herself snoring or gasping for breath.  Past Medical History:  Diagnosis Date   Hypertension    Stroke [facility])     Past Surgical History:  Procedure Laterality Date   CHOLECYSTECTOMY     ICD IMPLANT N/A 03/02/2023   Procedure: ICD IMPLANT;  Surgeon: Robles Robles, Donnamae Gaba, Melody Robles;  Location: [facility] INVASIVE CV LAB;  Service: Cardiovascular;  Laterality: N/A;   TEE WITHOUT CARDIOVERSION N/A 01/06/2023   Procedure: TRANSESOPHAGEAL ECHOCARDIOGRAM;  Surgeon: Bridgette Campus, Melody Robles;  Location: [facility] INVASIVE CV LAB;  Service: Cardiovascular;  Laterality: N/A;    Current Medications: Current Meds  Medication Sig   apixaban   (ELIQUIS ) 5 MG TABS tablet Take 1 tablet (5 mg total) by mouth 2 (two) times daily.   atorvastatin  (LIPITOR) 40 MG tablet Take 1 tablet (40 mg total) by mouth daily.   empagliflozin  (JARDIANCE ) 10 MG TABS tablet Take 1 tablet (10 mg total) by mouth daily.   ivabradine  (CORLANOR) 5 MG TABS tablet Take 0.5 tablets (2.5 mg total) by mouth 2 (two) times daily with a meal.   metFORMIN  (GLUCOPHAGE ) 500 MG tablet TAKE 1 TABLET BY MOUTH 2 TIMES DAILY WITH A MEAL, IF TOLERATERED 2 IN AM AND 1 IN PM, THEN 2 TABLET TWICE DAILY IF TOLERATED   metoprolol  succinate (TOPROL -XL) 50 MG 24 hr tablet Take 50 mg by mouth daily.   sacubitril-valsartan (ENTRESTO ) 97-103 MG TAKE ONE (1) TABLET BY MOUTH TWICE DAILY   spironolactone  (ALDACTONE ) 25 MG tablet TAKE 1 TABLET BY MOUTH DAILY    Allergies:   Benadryl [diphenhydramine] and Morphine   Social History   Socioeconomic History   Marital status: Single    Spouse name: Not on file   Number of children: 3   Years of education: Not on file   Highest education level: 11th grade  Occupational History   Occupation: disability  Tobacco Use   Smoking status: Former    Current packs/day: 0.00    Types: Cigarettes    Start date: 12/21/1992  Quit date: 12/22/2022    Years since quitting: 0.9   Smokeless tobacco: Never  Vaping Use   Vaping status: Never Used  Substance and Sexual Activity   Alcohol use: Never   Drug use: Never   Sexual activity: Not on file  Other Topics Concern   Not on file  Social History Narrative   Right Handed   No Caffeine use   Pt lives with family'   Pt doesn't work    Social Drivers of Corporate investment banker Strain: Low Risk  (01/13/2023)   Overall Financial Resource Strain (CARDIA)    Difficulty of Paying Living Expenses: Not hard at all  Food Insecurity: Not on File (04/08/2023)   Received from [facility] Insecurity    Food: 0  Transportation Needs: No Transportation Needs (01/13/2023)   PRAPARE - Therapist, art (Medical): No    Lack of Transportation (Non-Medical): No  Physical Activity: Inactive (01/13/2023)   Exercise Vital Sign    Days of Exercise per Week: 0 days    Minutes of Exercise per Session: 0 min  Stress: No Stress Concern Present (01/13/2023)   [employer] of Occupational Health - Occupational Stress Questionnaire    Feeling of Stress : Not at all  Social Connections: Not on File (03/27/2023)   Received from [facility]   Social Connections    Connectedness: 0  Recent Concern: Social Connections - Moderately Isolated (01/13/2023)   Social Connection and Isolation Panel [NHANES]    Frequency of Communication with Friends and Family: More than three times a week    Frequency of Social Gatherings with Friends and Family: Three times a week    Attends Religious Services: More than 4 times per year    Active Member of Clubs or Organizations: No    Attends Banker Meetings: Never    Marital Status: Never married     Family History:  The patient's family history includes Diabetes in her mother; Hypertension in her mother; Stroke in her maternal aunt.   ROS:   Please see the history of present illness.    ROS All other systems reviewed and are negative.      No data to display             PHYSICAL EXAM:   VS:  BP 136/68   Pulse (!) 54   Ht 5\' 6"  (1.676 m)   Wt 253 lb (114.8 kg)   SpO2 97%   BMI 40.84 kg/m    GEN: Well nourished, well developed, in no acute distress  HEENT: normal  Neck: no JVD, carotid bruits, or masses Cardiac: RRR; no murmurs, rubs, or gallops,no edema.  Intact distal pulses bilaterally.  Respiratory:  clear to auscultation bilaterally, normal work of breathing GI: soft, nontender, nondistended, + BS MS: no deformity or atrophy  Skin: warm and dry, no rash Neuro:  Alert and Oriented x 3, Strength and sensation are intact Psych: euthymic mood, full affect  Wt Readings from Last 3 Encounters:  12/07/23 253 lb  (114.8 kg)  11/01/23 253 lb 6.4 oz (114.9 kg)  09/08/23 246 lb 9.6 oz (111.9 kg)      Studies/Labs Reviewed:   Home sleep study  Recent Labs: 01/03/2023: ALT 30 08/02/2023: B Natriuretic Peptide 317.7; Magnesium 2.2 11/01/2023: BUN 17; Creatinine, Ser 1.09; Hemoglobin 12.8; Platelets 322; Potassium 3.9; Sodium 137   ASSESSMENT:    1. OSA (obstructive sleep apnea)   2. Primary  hypertension      PLAN:  In order of problems listed above:  #OSA - Stop Bang Score 2 - Patient underwent recent home sleep study due to episodes of NSVT - Sleep study showed very minimal obstructive sleep apnea with an AHI of 5.4/h. - I do not think this very minimal degree of OSA is having any cardiac effects - she has no daytime sleepiness and does not snore and sleeps well at night - At this time I do not think there is an indication for CPAP or other therapy  #Hypertension - BP controlled on exam today - Continue Toprol  XL 50 mg daily, Entresto  97-23 mg twice daily and spironolactone  25 mg daily with as needed refills  Followup PRN  Time Spent: 20 minutes total time of encounter, including 15 minutes spent in face-to-face patient care on the date of this encounter. This time includes coordination of care and counseling regarding above mentioned problem list. Remainder of non-face-to-face time involved reviewing chart documents/testing relevant to the patient encounter and documentation in the medical record. I have independently reviewed documentation from referring provider  Medication Adjustments/Labs and Tests Ordered: Current medicines are reviewed at length with the patient today.  Concerns regarding medicines are outlined above.  Medication changes, Labs and Tests ordered today are listed in the Patient Instructions below.  There are no Patient Instructions on file for this visit.   Signed, Gaylyn Keas, Melody Robles  12/07/2023 10:23 AM    [facility] [address] [place],  [place], [place]  [address] Phone: [phone]; Fax: [phone]

## 2023-12-15 ENCOUNTER — Ambulatory Visit: Payer: Self-pay | Admitting: Cardiovascular Disease

## 2023-12-20 ENCOUNTER — Other Ambulatory Visit: Payer: Self-pay | Admitting: Student

## 2023-12-20 DIAGNOSIS — E119 Type 2 diabetes mellitus without complications: Secondary | ICD-10-CM

## 2023-12-20 DIAGNOSIS — E785 Hyperlipidemia, unspecified: Secondary | ICD-10-CM

## 2023-12-20 DIAGNOSIS — I5020 Unspecified systolic (congestive) heart failure: Secondary | ICD-10-CM

## 2023-12-21 NOTE — Telephone Encounter (Signed)
 Medication sent to pharmacy

## 2023-12-29 ENCOUNTER — Other Ambulatory Visit: Payer: Self-pay | Admitting: Student

## 2024-01-11 NOTE — Progress Notes (Signed)
 Remote ICD transmission.

## 2024-02-17 ENCOUNTER — Other Ambulatory Visit (HOSPITAL_COMMUNITY): Payer: Self-pay | Admitting: Cardiology

## 2024-03-03 ENCOUNTER — Ambulatory Visit (INDEPENDENT_AMBULATORY_CARE_PROVIDER_SITE_OTHER): Payer: 59

## 2024-03-03 DIAGNOSIS — I428 Other cardiomyopathies: Secondary | ICD-10-CM | POA: Diagnosis not present

## 2024-03-03 LAB — CUP PACEART REMOTE DEVICE CHECK
Battery Remaining Longevity: 158 mo
Battery Voltage: 3.04 V
Brady Statistic RV Percent Paced: 0.01 %
Date Time Interrogation Session: 20250822025450
HighPow Impedance: 72 Ohm
Implantable Lead Connection Status: 753985
Implantable Lead Implant Date: 20240820
Implantable Lead Location: 753860
Implantable Pulse Generator Implant Date: 20240820
Lead Channel Impedance Value: 475 Ohm
Lead Channel Impedance Value: 570 Ohm
Lead Channel Pacing Threshold Amplitude: 0.5 V
Lead Channel Pacing Threshold Pulse Width: 0.4 ms
Lead Channel Sensing Intrinsic Amplitude: 17.4 mV
Lead Channel Setting Pacing Amplitude: 2 V
Lead Channel Setting Pacing Pulse Width: 0.4 ms
Lead Channel Setting Sensing Sensitivity: 0.3 mV
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
## 2024-03-09 ENCOUNTER — Telehealth (HOSPITAL_COMMUNITY): Payer: Self-pay

## 2024-03-09 NOTE — Telephone Encounter (Signed)
 Called to confirm/remind patient of their appointment at the [facility] on 03/10/24.   Appointment:   [x] Confirmed  [] Left mess   [] No answer/No voice mail  [] VM Full/unable to leave message  [] Phone not in service  Patient reminded to bring all medications and/or complete list.  Confirmed patient has transportation. Gave directions, instructed to utilize valet parking.

## 2024-03-10 ENCOUNTER — Ambulatory Visit (HOSPITAL_COMMUNITY)
Admission: RE | Admit: 2024-03-10 | Discharge: 2024-03-10 | Disposition: A | Discharge status: Home / Self Care | Source: Ambulatory Visit | Attending: Family Medicine | Admitting: Family Medicine

## 2024-03-10 ENCOUNTER — Ambulatory Visit (HOSPITAL_COMMUNITY): Payer: Self-pay | Admitting: Family Medicine

## 2024-03-10 ENCOUNTER — Encounter (HOSPITAL_COMMUNITY): Payer: Self-pay

## 2024-03-10 VITALS — BP 120/80 | HR 67 | Wt 254.0 lb

## 2024-03-10 DIAGNOSIS — I4729 Other ventricular tachycardia: Secondary | ICD-10-CM | POA: Diagnosis not present

## 2024-03-10 DIAGNOSIS — Z7901 Long term (current) use of anticoagulants: Secondary | ICD-10-CM | POA: Diagnosis not present

## 2024-03-10 DIAGNOSIS — Z79899 Other long term (current) drug therapy: Secondary | ICD-10-CM | POA: Diagnosis not present

## 2024-03-10 DIAGNOSIS — I340 Nonrheumatic mitral (valve) insufficiency: Secondary | ICD-10-CM | POA: Diagnosis not present

## 2024-03-10 DIAGNOSIS — I639 Cerebral infarction, unspecified: Secondary | ICD-10-CM | POA: Diagnosis not present

## 2024-03-10 DIAGNOSIS — Z7984 Long term (current) use of oral hypoglycemic drugs: Secondary | ICD-10-CM | POA: Diagnosis not present

## 2024-03-10 DIAGNOSIS — I110 Hypertensive heart disease with heart failure: Secondary | ICD-10-CM | POA: Diagnosis present

## 2024-03-10 DIAGNOSIS — E119 Type 2 diabetes mellitus without complications: Secondary | ICD-10-CM | POA: Insufficient documentation

## 2024-03-10 DIAGNOSIS — E669 Obesity, unspecified: Secondary | ICD-10-CM | POA: Diagnosis not present

## 2024-03-10 DIAGNOSIS — E785 Hyperlipidemia, unspecified: Secondary | ICD-10-CM | POA: Insufficient documentation

## 2024-03-10 DIAGNOSIS — Z87891 Personal history of nicotine dependence: Secondary | ICD-10-CM | POA: Insufficient documentation

## 2024-03-10 DIAGNOSIS — G4733 Obstructive sleep apnea (adult) (pediatric): Secondary | ICD-10-CM | POA: Diagnosis not present

## 2024-03-10 DIAGNOSIS — I5022 Chronic systolic (congestive) heart failure: Secondary | ICD-10-CM | POA: Insufficient documentation

## 2024-03-10 DIAGNOSIS — Z8673 Personal history of transient ischemic attack (TIA), and cerebral infarction without residual deficits: Secondary | ICD-10-CM | POA: Insufficient documentation

## 2024-03-10 LAB — BASIC METABOLIC PANEL WITH GFR
Anion gap: 13 (ref 5–15)
BUN: 12 mg/dL (ref 6–20)
CO2: 21 mmol/L — ABNORMAL LOW (ref 22–32)
Calcium: 9.2 mg/dL (ref 8.9–10.3)
Chloride: 106 mmol/L (ref 98–111)
Creatinine, Ser: 1.14 mg/dL — ABNORMAL HIGH (ref 0.44–1.00)
GFR, Estimated: 58 mL/min — ABNORMAL LOW (ref >60)
Glucose, Bld: 92 mg/dL (ref 70–99)
Potassium: 4.1 mmol/L (ref 3.5–5.1)
Sodium: 140 mmol/L (ref 135–145)

## 2024-03-10 LAB — CBC
HCT: 43.9 % (ref 36.0–46.0)
Hemoglobin: 13.4 g/dL (ref 12.0–15.0)
MCH: 24.4 pg — ABNORMAL LOW (ref 26.0–34.0)
MCHC: 30.5 g/dL (ref 30.0–36.0)
MCV: 79.8 fL — ABNORMAL LOW (ref 80.0–100.0)
Platelets: 376 K/uL (ref 150–400)
RBC: 5.5 MIL/uL — ABNORMAL HIGH (ref 3.87–5.11)
RDW: 15.7 % — ABNORMAL HIGH (ref 11.5–15.5)
WBC: 6.2 K/uL (ref 4.0–10.5)
nRBC: 0 % (ref 0.0–0.2)

## 2024-03-10 NOTE — Patient Instructions (Signed)
 No change in medications. Labs today - will call you if abnormal. Return to see Dr. Cherrie in 3 months with echo.  CALL [phone] IN NOVEMBER TO SCHEDULE THIS APPOINTMENT.  Please call [phone] if any questions or concerns prior to your next visit.

## 2024-03-16 ENCOUNTER — Ambulatory Visit: Payer: Self-pay | Admitting: Cardiovascular Disease

## 2024-03-21 ENCOUNTER — Encounter: Payer: Self-pay | Admitting: Family Medicine

## 2024-03-21 ENCOUNTER — Encounter: Payer: Self-pay | Admitting: Cardiovascular Disease

## 2024-04-04 NOTE — Progress Notes (Signed)
Remote ICD Transmission.

## 2024-04-27 ENCOUNTER — Other Ambulatory Visit: Payer: Self-pay | Admitting: Student

## 2024-04-27 DIAGNOSIS — E119 Type 2 diabetes mellitus without complications: Secondary | ICD-10-CM

## 2024-04-27 NOTE — Telephone Encounter (Signed)
 Medication sent to pharmacy

## 2024-05-05 ENCOUNTER — Encounter (HOSPITAL_COMMUNITY): Payer: Self-pay | Admitting: Cardiology
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
## 2024-05-25 ENCOUNTER — Ambulatory Visit: Attending: Cardiovascular Disease | Admitting: Cardiovascular Disease

## 2024-05-25 ENCOUNTER — Encounter: Payer: Self-pay | Admitting: Cardiovascular Disease

## 2024-05-25 VITALS — BP 117/79 | HR 78 | Ht 66.0 in | Wt 251.0 lb

## 2024-05-25 DIAGNOSIS — I472 Ventricular tachycardia, unspecified: Secondary | ICD-10-CM

## 2024-05-25 DIAGNOSIS — I428 Other cardiomyopathies: Secondary | ICD-10-CM | POA: Diagnosis not present

## 2024-05-25 DIAGNOSIS — I5022 Chronic systolic (congestive) heart failure: Secondary | ICD-10-CM | POA: Diagnosis not present

## 2024-05-25 DIAGNOSIS — I4729 Other ventricular tachycardia: Secondary | ICD-10-CM | POA: Diagnosis not present

## 2024-05-25 DIAGNOSIS — I493 Ventricular premature depolarization: Secondary | ICD-10-CM

## 2024-05-25 LAB — CUP PACEART INCLINIC DEVICE CHECK
Date Time Interrogation Session: 20251113094751
HighPow Impedance: 72 Ohm
Implantable Lead Connection Status: 753985
Implantable Lead Implant Date: 20240820
Implantable Lead Location: 753860
Implantable Pulse Generator Implant Date: 20240820
Lead Channel Impedance Value: 551 Ohm
Lead Channel Pacing Threshold Amplitude: 0.5 V
Lead Channel Pacing Threshold Amplitude: 0.5 V
Lead Channel Pacing Threshold Pulse Width: 0.4 ms
Lead Channel Pacing Threshold Pulse Width: 0.4 ms
Lead Channel Sensing Intrinsic Amplitude: >20
Lead Channel Sensing Intrinsic Amplitude: >20

## 2024-05-25 NOTE — Patient Instructions (Signed)
 Medication Instructions:  Your physician recommends that you continue on your current medications as directed. Please refer to the Current Medication list given to you today.  *If you need a refill on your cardiac medications before your next appointment, please call your pharmacy*  Lab Work: None ordered.  If you have labs (blood work) drawn today and your tests are completely normal, you will receive your results only by: MyChart Message (if you have MyChart) OR A paper copy in the mail If you have any lab test that is abnormal or we need to change your treatment, we will call you to review the results.  Testing/Procedures: None ordered.   Follow-Up: At [facility], you and your health needs are our priority.  As part of our continuing mission to provide you with exceptional heart care, our providers are all part of one team.  This team includes your primary Cardiologist (physician) and Advanced Practice Providers or APPs (Physician Assistants and Nurse Practitioners) who all work together to provide you with the care you need, when you need it.  Your next appointment:   12 months with EP APP

## 2024-06-02 ENCOUNTER — Ambulatory Visit: Payer: 59

## 2024-06-02 DIAGNOSIS — I5022 Chronic systolic (congestive) heart failure: Secondary | ICD-10-CM | POA: Diagnosis not present

## 2024-06-02 LAB — CUP PACEART REMOTE DEVICE CHECK
Battery Remaining Longevity: 156 mo
Battery Voltage: 3.03 V
Brady Statistic RA Percent Paced: INVALID
Brady Statistic RV Percent Paced: 0.01 %
Date Time Interrogation Session: 20251120191517
HighPow Impedance: 78 Ohm
Implantable Lead Connection Status: 753985
Implantable Lead Implant Date: 20240820
Implantable Lead Location: 753860
Implantable Pulse Generator Implant Date: 20240820
Lead Channel Impedance Value: 437 Ohm
Lead Channel Impedance Value: 532 Ohm
Lead Channel Pacing Threshold Amplitude: 0.5 V
Lead Channel Pacing Threshold Pulse Width: 0.4 ms
Lead Channel Sensing Intrinsic Amplitude: 18.1 mV
Lead Channel Setting Pacing Amplitude: 2 V
Lead Channel Setting Pacing Pulse Width: 0.4 ms
Lead Channel Setting Sensing Sensitivity: 0.3 mV

## 2024-06-05 NOTE — Progress Notes (Signed)
 Remote ICD Transmission

## 2024-06-13 ENCOUNTER — Ambulatory Visit: Payer: Self-pay | Admitting: Cardiovascular Disease

## 2024-06-16 ENCOUNTER — Other Ambulatory Visit (HOSPITAL_COMMUNITY): Payer: Self-pay | Admitting: Internal Medicine
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
## 2024-06-20 ENCOUNTER — Ambulatory Visit (HOSPITAL_COMMUNITY)
Admission: RE | Admit: 2024-06-20 | Discharge: 2024-06-20 | Disposition: A | Source: Ambulatory Visit | Attending: Family Medicine | Admitting: Family Medicine

## 2024-06-20 ENCOUNTER — Inpatient Hospital Stay (HOSPITAL_COMMUNITY)
Admission: RE | Admit: 2024-06-20 | Discharge: 2024-06-20 | Disposition: A | Source: Ambulatory Visit | Attending: Internal Medicine | Admitting: Internal Medicine

## 2024-06-20 VITALS — BP 104/70 | HR 67 | Wt 249.0 lb

## 2024-06-20 DIAGNOSIS — E119 Type 2 diabetes mellitus without complications: Secondary | ICD-10-CM

## 2024-06-20 DIAGNOSIS — I5022 Chronic systolic (congestive) heart failure: Secondary | ICD-10-CM | POA: Diagnosis present

## 2024-06-20 DIAGNOSIS — I5020 Unspecified systolic (congestive) heart failure: Secondary | ICD-10-CM

## 2024-06-20 DIAGNOSIS — I340 Nonrheumatic mitral (valve) insufficiency: Secondary | ICD-10-CM

## 2024-06-20 DIAGNOSIS — Z87891 Personal history of nicotine dependence: Secondary | ICD-10-CM

## 2024-06-20 DIAGNOSIS — I472 Ventricular tachycardia, unspecified: Secondary | ICD-10-CM

## 2024-06-20 LAB — ECHOCARDIOGRAM COMPLETE
Area-P 1/2: 3.85 cm2
Calc EF: 24.5 %
S' Lateral: 6.55 cm
Single Plane A2C EF: 24.7 %
Single Plane A4C EF: 24.8 %

## 2024-06-20 NOTE — Patient Instructions (Signed)
 There has been no changes your medications   You have been referred to Dr. Danford Pac. Her office will call you to arrange your follow up appointment.  Your physician recommends that you schedule a follow-up appointment in: 6 months ( June 2026) ** PLEASE CALL THE OFFICE IN APRIL TO ARRANGE YOUR FOLLOW UP APPOINTMENT.**  If you have any questions or concerns before your next appointment please send us  a message through [place] or call our office at [phone].    TO LEAVE A MESSAGE FOR THE NURSE SELECT OPTION 2, PLEASE LEAVE A MESSAGE INCLUDING: YOUR NAME DATE OF BIRTH CALL BACK NUMBER REASON FOR CALL**this is important as we prioritize the call backs  YOU WILL RECEIVE A CALL BACK THE SAME DAY AS LONG AS YOU CALL BEFORE 4:00 PM  At the [facility], you and your health needs are our priority. As part of our continuing mission to provide you with exceptional heart care, we have created designated Provider Care Teams. These Care Teams include your primary Cardiologist (physician) and Advanced Practice Providers (APPs- Physician Assistants and Nurse Practitioners) who all work together to provide you with the care you need, when you need it.   You may see any of the following providers on your designated Care Team at your next follow up: Dr Toribio Fuel Dr Ezra Shuck Dr. Morene Brownie Greig Mosses, NP Caffie Shed, [place] [facility] [place], [place] Beckey Coe, NP Jordan Lee, NP Ellouise Class, NP Tinnie Redman, PharmD Jaun Bash, PharmD   Please be sure to bring in all your medications bottles to every appointment.    Thank you for choosing [facility]

## 2024-07-02 ENCOUNTER — Encounter: Payer: Self-pay | Admitting: Internal Medicine

## 2024-08-10 ENCOUNTER — Other Ambulatory Visit: Payer: Self-pay

## 2024-08-10 DIAGNOSIS — Z1231 Encounter for screening mammogram for malignant neoplasm of breast: Secondary | ICD-10-CM

## 2024-08-22 ENCOUNTER — Ambulatory Visit: Admitting: Genetic Counselor

## 2024-08-23 ENCOUNTER — Ambulatory Visit
